# Patient Record
Sex: Male | Born: 1980
Health system: Southern US, Community
[De-identification: ages and names within clinical notes are randomized; demographics above are authoritative.]

## PROBLEM LIST (undated history)

## (undated) DIAGNOSIS — Z22322 Carrier or suspected carrier of Methicillin resistant Staphylococcus aureus: Secondary | ICD-10-CM

## (undated) DIAGNOSIS — K219 Gastro-esophageal reflux disease without esophagitis: Secondary | ICD-10-CM

## (undated) DIAGNOSIS — F419 Anxiety disorder, unspecified: Secondary | ICD-10-CM

## (undated) DIAGNOSIS — J45909 Unspecified asthma, uncomplicated: Secondary | ICD-10-CM

## (undated) HISTORY — PX: HERNIA REPAIR: SHX51

## (undated) HISTORY — PX: CHOLECYSTECTOMY: SHX55

---

## 2009-02-14 ENCOUNTER — Emergency Department (HOSPITAL_BASED_OUTPATIENT_CLINIC_OR_DEPARTMENT_OTHER): Admission: EM | Admit: 2009-02-14 | Discharge: 2009-02-14 | Payer: Self-pay | Admitting: Emergency Medicine

## 2009-02-14 ENCOUNTER — Ambulatory Visit: Payer: Self-pay | Admitting: Diagnostic Radiology

## 2009-05-18 ENCOUNTER — Emergency Department (HOSPITAL_BASED_OUTPATIENT_CLINIC_OR_DEPARTMENT_OTHER): Admission: EM | Admit: 2009-05-18 | Discharge: 2009-05-18 | Payer: Self-pay | Admitting: Emergency Medicine

## 2009-05-18 ENCOUNTER — Ambulatory Visit: Payer: Self-pay | Admitting: Radiology

## 2009-07-20 ENCOUNTER — Emergency Department (HOSPITAL_BASED_OUTPATIENT_CLINIC_OR_DEPARTMENT_OTHER): Admission: EM | Admit: 2009-07-20 | Discharge: 2009-07-20 | Payer: Self-pay | Admitting: Emergency Medicine

## 2009-07-20 ENCOUNTER — Ambulatory Visit: Payer: Self-pay | Admitting: Radiology

## 2009-08-29 ENCOUNTER — Encounter: Admission: RE | Admit: 2009-08-29 | Discharge: 2009-10-11 | Payer: Self-pay | Admitting: Orthopedic Surgery

## 2009-11-24 ENCOUNTER — Emergency Department (HOSPITAL_BASED_OUTPATIENT_CLINIC_OR_DEPARTMENT_OTHER): Admission: EM | Admit: 2009-11-24 | Discharge: 2009-11-24 | Payer: Self-pay | Admitting: Emergency Medicine

## 2010-02-02 ENCOUNTER — Emergency Department (HOSPITAL_BASED_OUTPATIENT_CLINIC_OR_DEPARTMENT_OTHER)
Admission: EM | Admit: 2010-02-02 | Discharge: 2010-02-02 | Payer: Self-pay | Source: Home / Self Care | Admitting: Emergency Medicine

## 2010-02-02 ENCOUNTER — Ambulatory Visit: Payer: Self-pay | Admitting: Diagnostic Radiology

## 2010-06-15 LAB — POCT CARDIAC MARKERS
CKMB, poc: <1 ng/mL — ABNORMAL LOW (ref 1.0–8.0)
Troponin i, poc: <0.05 ng/mL (ref 0.00–0.09)

## 2010-11-17 ENCOUNTER — Emergency Department (HOSPITAL_BASED_OUTPATIENT_CLINIC_OR_DEPARTMENT_OTHER)
Admission: EM | Admit: 2010-11-17 | Discharge: 2010-11-17 | Disposition: A | Discharge status: Home / Self Care | Payer: Self-pay | Attending: Emergency Medicine | Admitting: Emergency Medicine

## 2010-11-17 ENCOUNTER — Encounter: Payer: Self-pay | Admitting: *Deleted

## 2010-11-17 DIAGNOSIS — K219 Gastro-esophageal reflux disease without esophagitis: Secondary | ICD-10-CM | POA: Insufficient documentation

## 2010-11-17 DIAGNOSIS — L0390 Cellulitis, unspecified: Secondary | ICD-10-CM

## 2010-11-17 DIAGNOSIS — N498 Inflammatory disorders of other specified male genital organs: Secondary | ICD-10-CM | POA: Insufficient documentation

## 2010-11-17 DIAGNOSIS — Z8614 Personal history of Methicillin resistant Staphylococcus aureus infection: Secondary | ICD-10-CM | POA: Insufficient documentation

## 2010-11-17 HISTORY — DX: Carrier or suspected carrier of methicillin resistant Staphylococcus aureus: Z22.322

## 2010-11-17 HISTORY — DX: Gastro-esophageal reflux disease without esophagitis: K21.9

## 2010-11-17 MED ORDER — CLINDAMYCIN HCL 150 MG PO CAPS: 150.0000 mg | ORAL_CAPSULE | Freq: Four times a day (QID) | ORAL | Status: AC

## 2010-11-17 NOTE — ED Provider Notes (Signed)
History     CSN: [id number] Arrival date & time: 11/17/2010  9:44 PM  Chief Complaint  Patient presents with  . Abscess   Patient is a 30 y.o. male presenting with abscess. The history is provided by the patient.  Abscess  This is a recurrent problem. The current episode started less than one week ago. The problem occurs frequently. The problem has been unchanged. The abscess is present on the genitalia. The problem is moderate. The abscess is characterized by painfulness. Associated with: none. The abscess first occurred at home. Pertinent negatives include no fever, no diarrhea and no vomiting. Fever duration: subj fever this afternoon at home took some tylenol. He has been experiencing a mild sore throat. Neither side is more painful than the other. The sore throat is characterized by pain only. There were no sick contacts. Recent Medical Care: was recently on clindamycin for MRSA and about a week after being off of that medication developed new abscess.    Now has 3 small areas on his scrotum that are developing, one draining, no redness, they feel the same as previous abscesses.  PT feels lightheaded today, no HAs, no unilateral weakness, no trouble urinating.  Past Medical History  Diagnosis Date  . MRSA (methicillin resistant staph aureus) culture positive   . GERD (gastroesophageal reflux disease)     History reviewed. No pertinent past surgical history.  History reviewed. No pertinent family history.  History  Substance Use Topics  . Smoking status: Never Smoker   . Smokeless tobacco: Not on file  . Alcohol Use: Yes      Review of Systems  Constitutional: Negative for fever and chills.  HENT: Negative for neck pain and neck stiffness.   Eyes: Negative for pain.  Respiratory: Negative for shortness of breath.   Cardiovascular: Negative for chest pain.  Gastrointestinal: Negative for vomiting, abdominal pain and diarrhea.  Genitourinary: Negative for dysuria, discharge  and penile pain.  Musculoskeletal: Negative for myalgias, joint swelling and arthralgias.  Skin: Negative for rash.  Neurological: Negative for headaches.  All other systems reviewed and are negative.    Physical Exam  BP 121/69  Pulse 85  Temp(Src) 98.2 F (36.8 C) (Oral)  Resp 18  Ht 6\' 3"  (1.905 m)  Wt 245 lb (111.131 kg)  BMI 30.62 kg/m2  SpO2 99%  Physical Exam  Constitutional: He is oriented to person, place, and time. He appears well-developed and well-nourished.  HENT:  Head: Normocephalic and atraumatic.  Eyes: Conjunctivae and EOM are normal. Pupils are equal, round, and reactive to light.  Neck: Trachea normal. Neck supple. No thyromegaly present.  Cardiovascular: Normal rate, regular rhythm, S1 normal, S2 normal and normal pulses.     No systolic murmur is present   No diastolic murmur is present  Pulses:      Radial pulses are 2+ on the right side, and 2+ on the left side.  Pulmonary/Chest: Effort normal and breath sounds normal. He has no wheezes. He has no rhonchi. He has no rales. He exhibits no tenderness.  Abdominal: Soft. Normal appearance and bowel sounds are normal. There is no tenderness. There is no CVA tenderness and negative Murphy's sign.  Genitourinary:       3 small areas of mild TTP over scrotum, no active draining, no erythema or inc warmth to touch, no penile or testicular abscess. All areas are less than 1cm, no enalrged abscess and no cellulitis.   Musculoskeletal:       BLE:s  Calves nontender, no cords or erythema, negative Homans sign  Neurological: He is alert and oriented to person, place, and time. He has normal strength. No cranial nerve deficit or sensory deficit. GCS eye subscore is 4. GCS verbal subscore is 5. GCS motor subscore is 6.  Skin: Skin is warm and dry. No rash noted. He is not diaphoretic.  Psychiatric: His speech is normal.       Cooperative and appropriate    ED Course  Procedures  MDM 3 small areas of developing  abscess on the scrotum, has h/o multiple abscesses in the recent past improved with clinda. Plan RX now, no indication for IV ABx and no areas that are large enough to drain. Urology referral provided with strict return precautions for any worsening condition.       Sunnie Nielsen, MD 11/17/10 2308

## 2010-11-17 NOTE — ED Notes (Signed)
Pt has MRSA and developed abscesses on his scrotum x1 week. Pt sts he now has a fever and sore throat.

## 2011-01-05 ENCOUNTER — Emergency Department (HOSPITAL_BASED_OUTPATIENT_CLINIC_OR_DEPARTMENT_OTHER): Admission: EM | Admit: 2011-01-05 | Discharge: 2011-01-05 | Discharge status: Against Medical Advice | Payer: Self-pay

## 2011-01-05 NOTE — ED Notes (Signed)
Unable to find pt when called in w.a. He told registration he had to leave and pick up his child from school.

## 2011-11-11 IMAGING — CR DG FOOT COMPLETE 3+V*R*
3 series · 3 of 3 positions shown · non-contrast
Comparison: The

CLINICAL DATA: Medial and lateral foot pain following blunt trauma

RIGHT FOOT COMPLETE - 3+ VIEW

[t foot ap right]
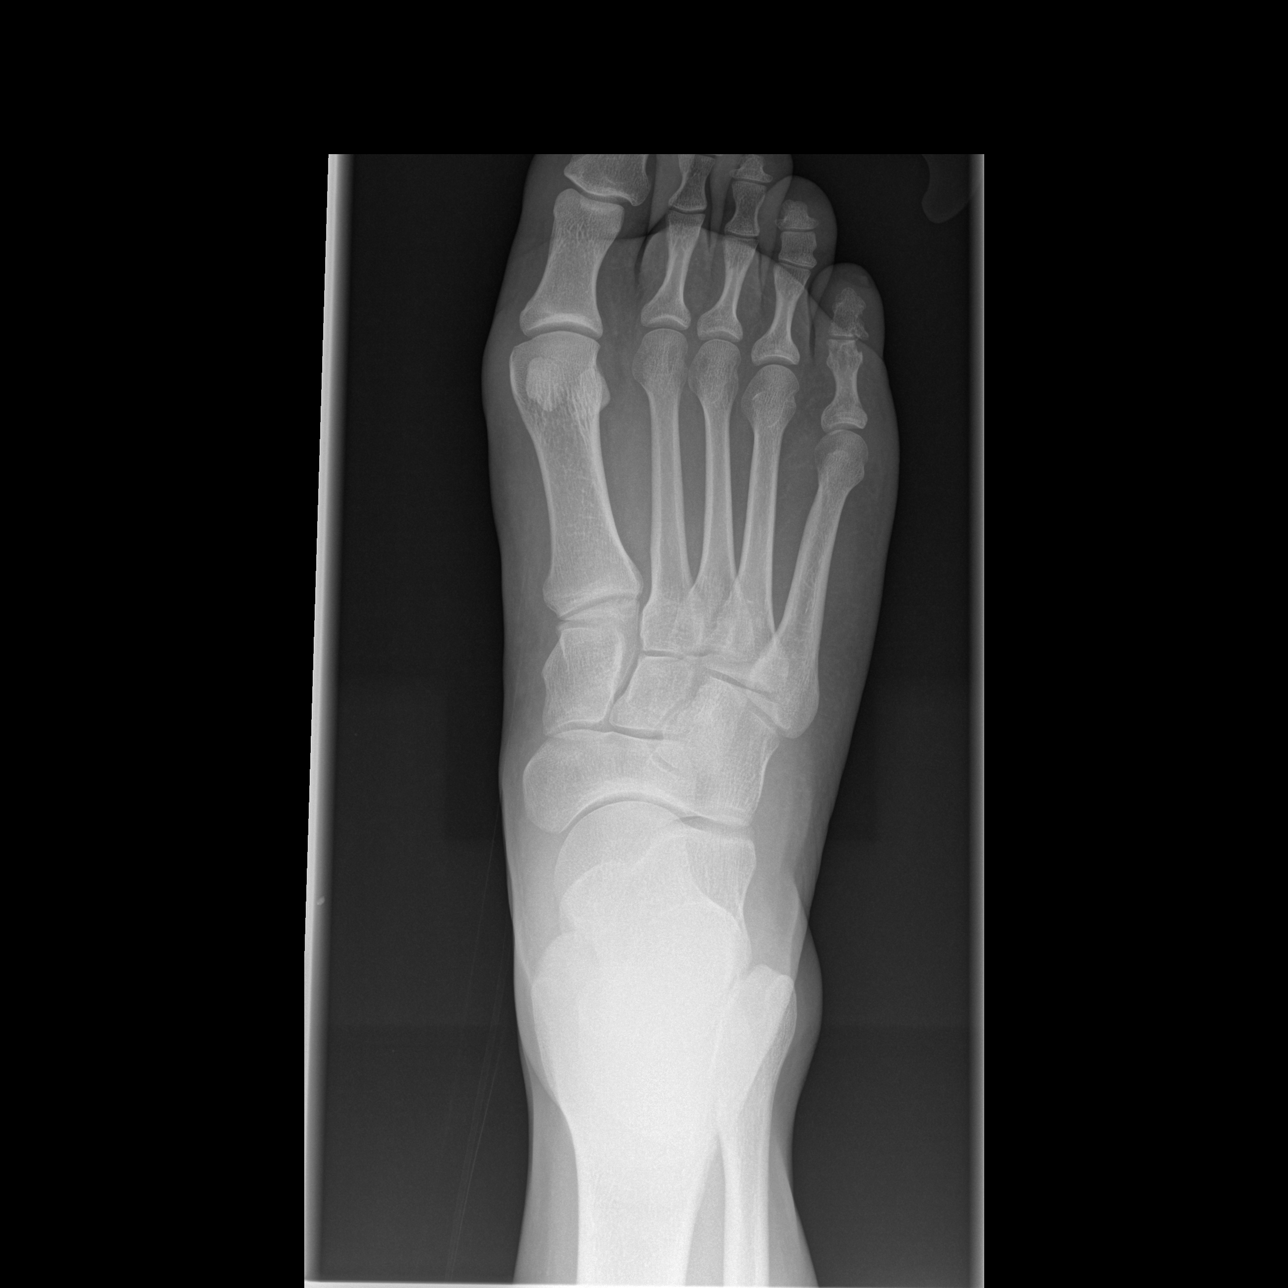

[t foot oblique right]
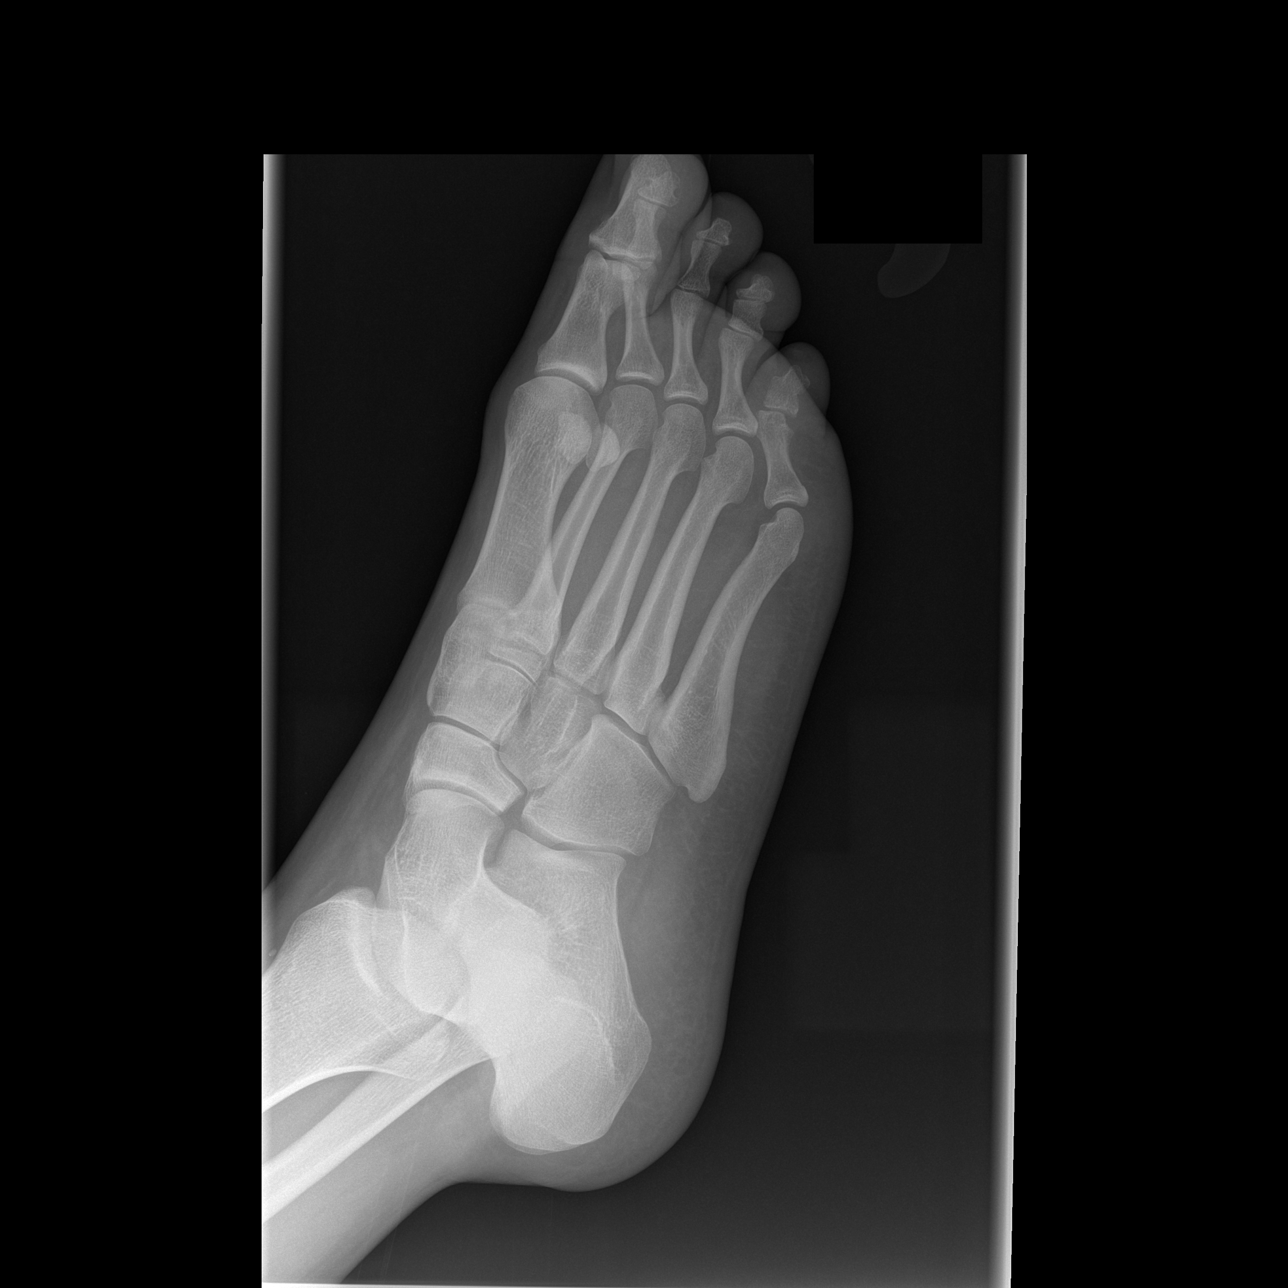

[t foot lat right]
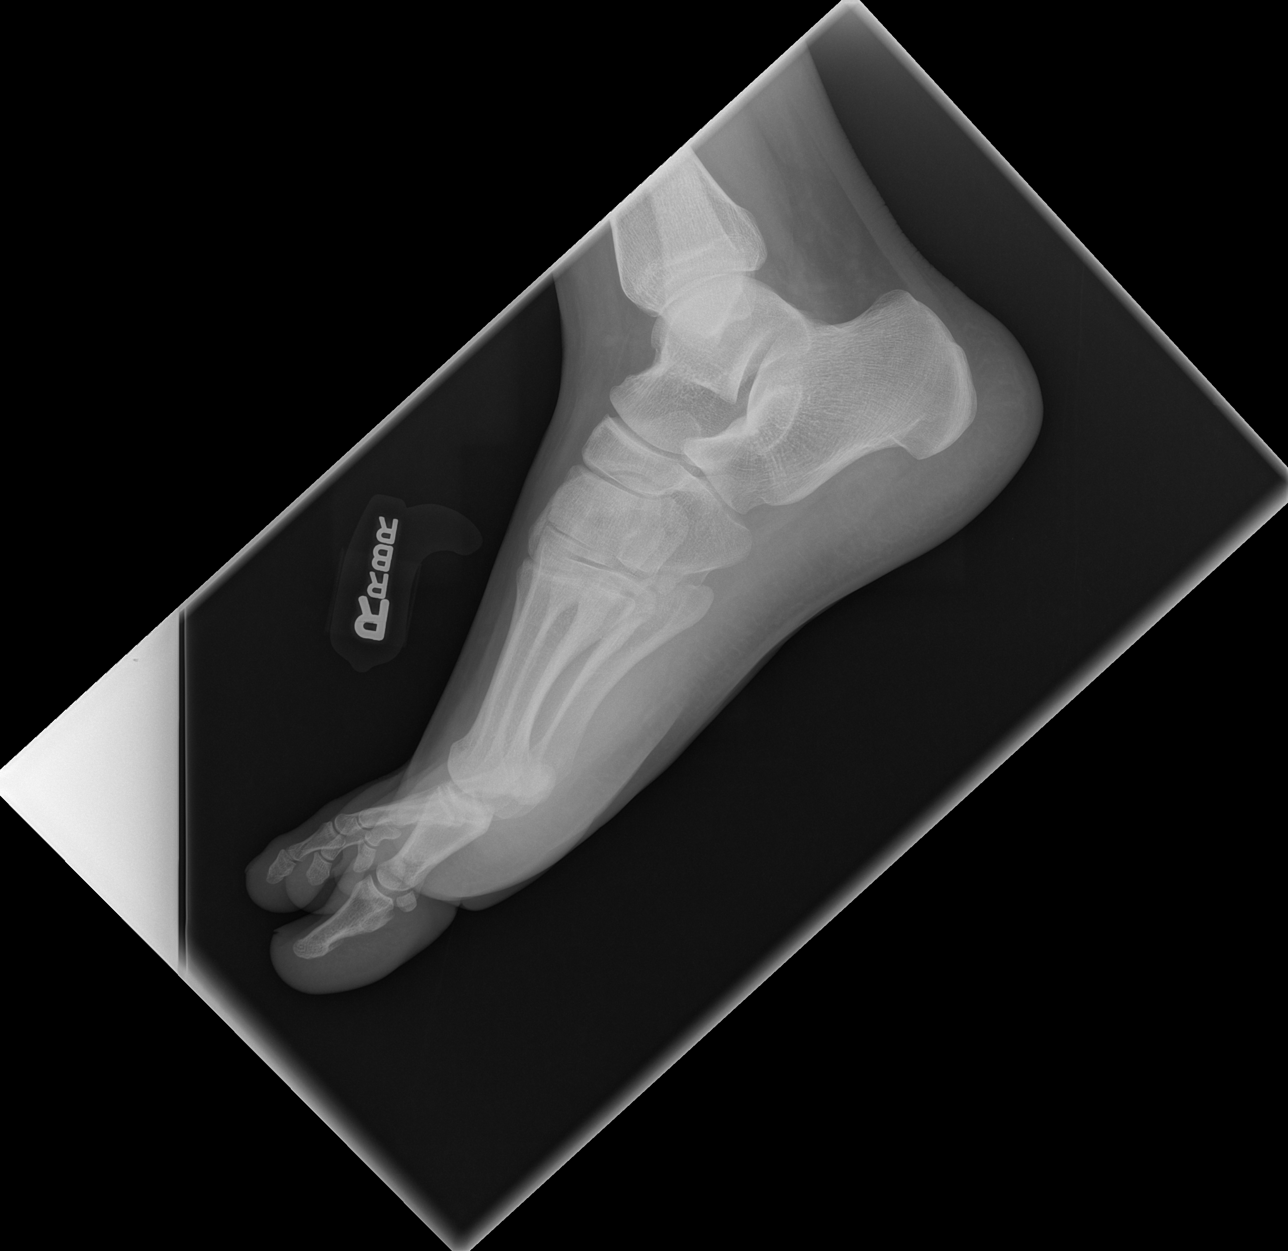

[3 of 3 positions shown; findings below may reference images not displayed]

FINDINGS: No fracture or dislocation.  No foreign body or other
abnormality of the soft tissues.
IMPRESSION: No acute or significant findings.

## 2011-11-13 ENCOUNTER — Encounter (HOSPITAL_BASED_OUTPATIENT_CLINIC_OR_DEPARTMENT_OTHER): Payer: Self-pay | Admitting: *Deleted

## 2011-11-13 ENCOUNTER — Emergency Department (HOSPITAL_BASED_OUTPATIENT_CLINIC_OR_DEPARTMENT_OTHER)
Admission: EM | Admit: 2011-11-13 | Discharge: 2011-11-14 | Disposition: A | Discharge status: Home / Self Care | Payer: Self-pay | Attending: Emergency Medicine | Admitting: Emergency Medicine

## 2011-11-13 DIAGNOSIS — K219 Gastro-esophageal reflux disease without esophagitis: Secondary | ICD-10-CM | POA: Insufficient documentation

## 2011-11-13 DIAGNOSIS — Z8614 Personal history of Methicillin resistant Staphylococcus aureus infection: Secondary | ICD-10-CM | POA: Insufficient documentation

## 2011-11-13 DIAGNOSIS — W268XXA Contact with other sharp object(s), not elsewhere classified, initial encounter: Secondary | ICD-10-CM | POA: Insufficient documentation

## 2011-11-13 DIAGNOSIS — S61209A Unspecified open wound of unspecified finger without damage to nail, initial encounter: Secondary | ICD-10-CM | POA: Insufficient documentation

## 2011-11-13 DIAGNOSIS — Y998 Other external cause status: Secondary | ICD-10-CM | POA: Insufficient documentation

## 2011-11-13 DIAGNOSIS — Y9389 Activity, other specified: Secondary | ICD-10-CM | POA: Insufficient documentation

## 2011-11-13 DIAGNOSIS — S61219A Laceration without foreign body of unspecified finger without damage to nail, initial encounter: Secondary | ICD-10-CM

## 2011-11-13 MED ORDER — LIDOCAINE HCL 2 % IJ SOLN: 20.0000 mL | Freq: Once | INTRAMUSCULAR | Status: DC

## 2011-11-13 MED ORDER — LIDOCAINE HCL 2 % IJ SOLN
INTRAMUSCULAR | Status: AC
  Filled 2011-11-13: qty 1

## 2011-11-13 NOTE — ED Provider Notes (Signed)
History     CSN: [id number]  Arrival date & time 11/13/11  2211   First MD Initiated Contact with Patient 11/13/11 2248      Chief Complaint  Patient presents with  . Laceration    (Consider location/radiation/quality/duration/timing/severity/associated sxs/prior treatment) Patient is a 31 y.o. male presenting with skin laceration. The history is provided by the patient.  Laceration  The incident occurred less than 1 hour ago (Patient was breaking a metal panhandling half to put in the trash and it lacerated his finger). The laceration is located on the left hand. Size: Laceration to the right  fourth and  right fifth fingers. The laceration mechanism was a a metal edge. The pain is at a severity of 4/10. The pain is moderate. The pain has been constant since onset. He reports no foreign bodies present. His tetanus status is UTD.    Past Medical History  Diagnosis Date  . MRSA (methicillin resistant staph aureus) culture positive   . GERD (gastroesophageal reflux disease)     History reviewed. No pertinent past surgical history.  History reviewed. No pertinent family history.  History  Substance Use Topics  . Smoking status: Never Smoker   . Smokeless tobacco: Not on file  . Alcohol Use: Yes      Review of Systems  All other systems reviewed and are negative.    Allergies  Bactrim and Cephalexin  Home Medications   Current Outpatient Rx  Name Route Sig Dispense Refill  . ACETAMINOPHEN 500 MG PO TABS Oral Take by mouth every 6 (six) hours as needed. pain    . DIPHENHYDRAMINE HCL 25 MG PO TABS Oral Take 25 mg by mouth every 6 (six) hours as needed.    Marland Kitchen FLAXSEED OIL PO Oral Take 1 capsule by mouth daily.    Marland Kitchen PERCOCET PO Oral Take 1 tablet by mouth daily as needed. For pain.    Marland Kitchen RANITIDINE HCL 150 MG PO TABS Oral Take 150 mg by mouth 2 (two) times daily.        BP 134/78  Pulse 86  Temp 98.9 F (37.2 C) (Oral)  Resp 16  Ht 6\' 3"  (1.905 m)  Wt 240 lb  (108.863 kg)  BMI 30.00 kg/m2  SpO2 100%  Physical Exam  Nursing note and vitals reviewed. Constitutional: He is oriented to person, place, and time. He appears well-developed and well-nourished. No distress.  HENT:  Head: Normocephalic and atraumatic.  Mouth/Throat: Oropharynx is clear and moist.  Eyes: EOM are normal. Pupils are equal, round, and reactive to light.  Musculoskeletal:       Left hand: He exhibits laceration. He exhibits normal two-point discrimination. normal sensation noted. Normal strength noted.       Hands:      Tendon function of the fourth and fifth digits on the right hand. Extension at the PIP and DIP joints consistent with intact digitorum superficialis and profundus  Neurological: He is alert and oriented to person, place, and time. He has normal strength. No sensory deficit.  Skin: Skin is warm and dry. No rash noted. No erythema.    ED Course  Procedures (including critical care time)  Labs Reviewed - No data to display No results found.  LACERATION REPAIR Performed by: Gwyneth Sprout Authorized byGwyneth Sprout Consent: Verbal consent obtained. Risks and benefits: risks, benefits and alternatives were discussed Consent given by: patient Patient identity confirmed: provided demographic data Prepped and Draped in normal sterile fashion Wound explored  Laceration Location: Right  fifth finger proximal on the dorsal surface  Laceration Length: 4 cm  No Foreign Bodies seen or palpated  Anesthesia: local infiltration  Local anesthetic: lidocaine 1 % without epinephrine  Anesthetic total: 2 ml  Irrigation method: syringe Amount of cleaning: standard  Skin closure: 4.0 Prolene   Number of sutures: 7   Technique: Simple and are   Patient tolerance: Patient tolerated the procedure well with no immediate complications. LACERATION REPAIR Performed by: Gwyneth Sprout Authorized byGwyneth Sprout Consent: Verbal consent  obtained. Risks and benefits: risks, benefits and alternatives were discussed Consent given by: patient Patient identity confirmed: provided demographic data Prepped and Draped in normal sterile fashion Wound explored  Laceration Location: Right fourth finger proximal  Laceration Length: 2 cm  No Foreign Bodies seen or palpated  Anesthesia: local infiltration  Local anesthetic: lidocaine 1 % without epinephrine  Anesthetic total: 2 ml  Irrigation method: syringe Amount of cleaning: standard  Skin closure: 4.0 Prolene   Number of sutures: 4   Technique: Simple interrupted   Patient tolerance: Patient tolerated the procedure well with no immediate complications.   1. Finger laceration       MDM   Patient with laceration to the hand from metal object. No concern for foreign bodies. Wounds were explored and there was no sign of foreign body. They were deep lacerations to the the fourth of the fingers however patient has full range of motion without any signs of tendon involvement. He had normal sensation, capillary refill and movement. Wounds were repaired. Tetanus shot is up-to-date.        Gwyneth Sprout, MD 11/14/11 0009

## 2011-11-13 NOTE — ED Notes (Signed)
Pt c/o laceration to right 3rf and 4th finger by metal x 1 hr ago

## 2011-11-13 NOTE — ED Notes (Addendum)
MD at bedside. 

## 2011-11-22 ENCOUNTER — Emergency Department (HOSPITAL_BASED_OUTPATIENT_CLINIC_OR_DEPARTMENT_OTHER)
Admission: EM | Admit: 2011-11-22 | Discharge: 2011-11-23 | Disposition: A | Discharge status: Home / Self Care | Payer: Self-pay | Attending: Emergency Medicine | Admitting: Emergency Medicine

## 2011-11-22 ENCOUNTER — Encounter (HOSPITAL_BASED_OUTPATIENT_CLINIC_OR_DEPARTMENT_OTHER): Payer: Self-pay | Admitting: *Deleted

## 2011-11-22 DIAGNOSIS — Z4802 Encounter for removal of sutures: Secondary | ICD-10-CM | POA: Insufficient documentation

## 2011-11-22 DIAGNOSIS — M79641 Pain in right hand: Secondary | ICD-10-CM

## 2011-11-22 DIAGNOSIS — M79609 Pain in unspecified limb: Secondary | ICD-10-CM | POA: Insufficient documentation

## 2011-11-22 DIAGNOSIS — K219 Gastro-esophageal reflux disease without esophagitis: Secondary | ICD-10-CM | POA: Insufficient documentation

## 2011-11-22 DIAGNOSIS — Z8614 Personal history of Methicillin resistant Staphylococcus aureus infection: Secondary | ICD-10-CM | POA: Insufficient documentation

## 2011-11-22 NOTE — ED Notes (Signed)
Reports a rubber band type sensation that snaps when he moves both fingers

## 2011-11-22 NOTE — ED Notes (Signed)
Pt had lacerations to the 4th/5th digits of his right hand. Was seen here and had sutures and splint placed. Pt states the site was sore, but over the past two days the pain has worsened considerably. Denies fevers or worsening of wound appearance. Pt was scheduled to return tomorrow for suture removal.

## 2011-11-22 NOTE — ED Provider Notes (Addendum)
History     CSN: [id number]  Arrival date & time 11/22/11  2252   First MD Initiated Contact with Patient 11/22/11 2354      Chief Complaint  Patient presents with  . Hand Pain    (Consider location/radiation/quality/duration/timing/severity/associated sxs/prior treatment) HPI This is a 31 year old black male who was seen here on the 20th of this month for lacerations of the right hand. Specifically he had lacerations over the palmar aspect of the right fourth and fifth metacarpophalangeal joints. These were sutured and he was placed in a splint. His fingers were noted to be neurovascularly intact at the time with intact tendon function. He returns stating he has had increased pain in his right third fourth and fifth fingers. The pain is an achy pain, moderate in severity, worse with movement. He states he has not changed the amount of use of those fingers recently. He has been wearing a splint and taking it off for passive range of motion exercises. There is no erythema, swelling or drainage. He states his right fifth finger is somewhat harder to move them his other fingers; he describes it as feeling like there is a rubber band inside of it.  Past Medical History  Diagnosis Date  . MRSA (methicillin resistant staph aureus) culture positive   . GERD (gastroesophageal reflux disease)     History reviewed. No pertinent past surgical history.  No family history on file.  History  Substance Use Topics  . Smoking status: Never Smoker   . Smokeless tobacco: Not on file  . Alcohol Use: Yes      Review of Systems  All other systems reviewed and are negative.    Allergies  Bactrim and Cephalexin  Home Medications   Current Outpatient Rx  Name Route Sig Dispense Refill  . ACETAMINOPHEN 500 MG PO TABS Oral Take by mouth every 6 (six) hours as needed. pain    . DIPHENHYDRAMINE HCL 25 MG PO TABS Oral Take 25 mg by mouth every 6 (six) hours as needed. allergy    . FLAXSEED OIL  PO Oral Take 1 capsule by mouth 3 (three) times daily.     Marland Kitchen PERCOCET PO Oral Take 1 tablet by mouth daily as needed. For pain.    Marland Kitchen RANITIDINE HCL 150 MG PO TABS Oral Take 150 mg by mouth 2 (two) times daily.        BP 136/79  Pulse 90  Temp 98.4 F (36.9 C) (Oral)  Resp 18  Ht 6\' 3"  (1.905 m)  Wt 240 lb (108.863 kg)  BMI 30.00 kg/m2  SpO2 99%  Physical Exam General: Well-developed, well-nourished male in no acute distress; appearance consistent with age of record HENT: normocephalic, atraumatic Eyes: Normal appearance Neck: supple Heart: regular rate and rhythm Lungs: Normal respiratory effort and excursion Abdomen: soft; nondistended Extremities: No deformity; full range of motion, mild limitation of strength in the right fifth finger; sutured wounds healing well without erythema, warmth, swelling, lymphangitis, localized tenderness or drainage; right fourth and fifth fingers neurovascularly intact Neurologic: Awake, alert and oriented; motor function intact in all extremities and symmetric; no facial droop Skin: Warm and dry Psychiatric: Normal mood and affect    ED Course  Procedures (including critical care time)  SUTURE REMOVAL Performed by: Paula Libra L  Consent: Verbal consent obtained. Consent given by: patient Required items: required blood products, implants, devices, and special equipment available Time out: Immediately prior to procedure a "time out" was called to verify the correct patient,  procedure, equipment, support staff and site/side marked as required.  Location: Right fourth and fifth fingers at the palmar metacarpophalangeal joint  Wound Appearance: clean  Sutures/Staples Removed: 7 (5th finger) + 4 (4th finger)  Patient tolerance: Patient tolerated the procedure well with no immediate complications.     MDM  Patient was advised to continue range of motion exercises and to gradually increase use of his right hand. He was advised to use an  NSAID such as Aleve for pain. He states he has had a history of narcotic abuse and wishes to avoid narcotic pain medication.        Hanley Seamen, MD 11/23/11 0008  Hanley Seamen, MD 11/23/11 1610

## 2011-11-23 MED ORDER — NAPROXEN 250 MG PO TABS: 500.0000 mg | ORAL_TABLET | Freq: Once | ORAL | Status: DC

## 2012-01-21 ENCOUNTER — Emergency Department (HOSPITAL_BASED_OUTPATIENT_CLINIC_OR_DEPARTMENT_OTHER): Payer: Self-pay

## 2012-01-21 ENCOUNTER — Encounter (HOSPITAL_BASED_OUTPATIENT_CLINIC_OR_DEPARTMENT_OTHER): Payer: Self-pay | Admitting: *Deleted

## 2012-01-21 ENCOUNTER — Emergency Department (HOSPITAL_BASED_OUTPATIENT_CLINIC_OR_DEPARTMENT_OTHER)
Admission: EM | Admit: 2012-01-21 | Discharge: 2012-01-22 | Disposition: A | Discharge status: Home / Self Care | Payer: Self-pay | Attending: Emergency Medicine | Admitting: Emergency Medicine

## 2012-01-21 DIAGNOSIS — IMO0002 Reserved for concepts with insufficient information to code with codable children: Secondary | ICD-10-CM | POA: Insufficient documentation

## 2012-01-21 DIAGNOSIS — Y9289 Other specified places as the place of occurrence of the external cause: Secondary | ICD-10-CM | POA: Insufficient documentation

## 2012-01-21 DIAGNOSIS — Y9389 Activity, other specified: Secondary | ICD-10-CM | POA: Insufficient documentation

## 2012-01-21 DIAGNOSIS — Z79899 Other long term (current) drug therapy: Secondary | ICD-10-CM | POA: Insufficient documentation

## 2012-01-21 DIAGNOSIS — S93609A Unspecified sprain of unspecified foot, initial encounter: Secondary | ICD-10-CM | POA: Insufficient documentation

## 2012-01-21 DIAGNOSIS — Z8614 Personal history of Methicillin resistant Staphylococcus aureus infection: Secondary | ICD-10-CM | POA: Insufficient documentation

## 2012-01-21 DIAGNOSIS — S93602A Unspecified sprain of left foot, initial encounter: Secondary | ICD-10-CM

## 2012-01-21 DIAGNOSIS — K219 Gastro-esophageal reflux disease without esophagitis: Secondary | ICD-10-CM | POA: Insufficient documentation

## 2012-01-21 NOTE — ED Notes (Signed)
Left foot pain. Swelling. States he may have hurt it at a party this weekend but cant be for sure. Has been using ice and elevation for the swelling.

## 2012-01-21 NOTE — ED Provider Notes (Signed)
History   This chart was scribed for Hanley Seamen, MD by Albertha Ghee Rifaie. This patient was seen in room MH12/[facility]12 and the patient's care was started at 11:55 PM.    CSN: [id number]  Arrival date & time 01/21/12  2203   None     Chief Complaint  Patient presents with  . Foot Pain     HPI  Bryan Gill is a 31 y.o. male who presents to the Emergency Department complaining of couple of days of gradually worsening, gradual onset, moderate left foot pain associated with swelling. Pain is aggravated with movement and applying pressure on it. He states he has been in a party 3 days ago and he might hit his foot with something. Pt has been appling ice-pack for pain and it seemed to alleviate the pain. He also had been taking some OTC for pain and they have been helping reduce his pain. He denies fever, chills, emesis, nausea. Pt denies smoking and alcohol use.   Past Medical History  Diagnosis Date  . MRSA (methicillin resistant staph aureus) culture positive   . GERD (gastroesophageal reflux disease)     History reviewed. No pertinent past surgical history.  No family history on file.  History  Substance Use Topics  . Smoking status: Never Smoker   . Smokeless tobacco: Not on file  . Alcohol Use: Yes      Review of Systems  All other systems reviewed and are negative.    Allergies  Bactrim and Cephalexin  Home Medications   Current Outpatient Rx  Name Route Sig Dispense Refill  . ACETAMINOPHEN 500 MG PO TABS Oral Take by mouth every 6 (six) hours as needed. pain    . DIPHENHYDRAMINE HCL 25 MG PO TABS Oral Take 25 mg by mouth every 6 (six) hours as needed. allergy    . FLAXSEED OIL PO Oral Take 1 capsule by mouth 3 (three) times daily.     Marland Kitchen PERCOCET PO Oral Take 1 tablet by mouth daily as needed. For pain.    Marland Kitchen RANITIDINE HCL 150 MG PO TABS Oral Take 150 mg by mouth 2 (two) times daily.        BP 140/75  Pulse 101  Temp 98.7 F (37.1 C) (Oral)  Resp 20   SpO2 99%  Physical Exam  Constitutional: He is oriented to person, place, and time. He appears well-developed and well-nourished. No distress.  HENT:  Head: Normocephalic and atraumatic.  Eyes: EOM are normal. Pupils are equal, round, and reactive to light.  Neck: Normal range of motion. Neck supple.  Cardiovascular: Normal rate, regular rhythm and normal heart sounds.   Pulmonary/Chest: Effort normal and breath sounds normal. No respiratory distress.  Abdominal: Soft. Bowel sounds are normal. There is no tenderness.  Musculoskeletal:       Left foot dorsal swollen w/o erythema or warmth  Left drsal tenderness  Dorsal capillary refill is normal  Hurt with applied pressure on left foot.   Neurological: He is alert and oriented to person, place, and time.  Skin: Skin is warm. No rash noted. No erythema.    ED Course  Procedures (including critical care time)   DIAGNOSTIC STUDIES: Oxygen Saturation is 99% on room air, normal by my interpretation.    COORDINATION OF CARE: 11:51 PM Discussed treatment plan with pt at bedside and pt agreed to plan.   MDM   Nursing notes and vitals signs, including pulse oximetry, reviewed.  Summary of this visit's results,  reviewed by myself:  Imaging Studies: Dg Foot Complete Left  01/21/2012  *RADIOLOGY REPORT*  Clinical Data: Left foot pain.  LEFT FOOT - COMPLETE 3+ VIEW  Comparison: None.  Findings: Soft tissue swelling is present over the dorsum of the foot.  There is no underlying fracture.  No radiopaque foreign body is evident.  IMPRESSION:  1.  Moderate soft tissue swelling of the dorsum of the foot.  This raises concern for cellulitis. 2.  No acute osseous abnormality or radiopaque foreign body.   Original Report Authenticated By: Jamesetta Orleans. MATTERN, M.D.    Patient does not wish any additional analgesia. He states he was here primarily to see if he had a fracture. We'll provide him a post-op shoe and advise use of crutches to allow  foot the heel.       I personally performed the services described in this documentation, which was scribed in my presence.  The recorded information has been reviewed and considered.     Hanley Seamen, MD 01/22/12 [phone]

## 2012-04-05 ENCOUNTER — Encounter (HOSPITAL_BASED_OUTPATIENT_CLINIC_OR_DEPARTMENT_OTHER): Payer: Self-pay | Admitting: Emergency Medicine

## 2012-04-05 ENCOUNTER — Emergency Department (HOSPITAL_BASED_OUTPATIENT_CLINIC_OR_DEPARTMENT_OTHER)
Admission: EM | Admit: 2012-04-05 | Discharge: 2012-04-05 | Disposition: A | Discharge status: Home / Self Care | Payer: Self-pay | Attending: Emergency Medicine | Admitting: Emergency Medicine

## 2012-04-05 DIAGNOSIS — Z79899 Other long term (current) drug therapy: Secondary | ICD-10-CM | POA: Insufficient documentation

## 2012-04-05 DIAGNOSIS — Z8719 Personal history of other diseases of the digestive system: Secondary | ICD-10-CM | POA: Insufficient documentation

## 2012-04-05 DIAGNOSIS — L600 Ingrowing nail: Secondary | ICD-10-CM | POA: Insufficient documentation

## 2012-04-05 DIAGNOSIS — Z8614 Personal history of Methicillin resistant Staphylococcus aureus infection: Secondary | ICD-10-CM | POA: Insufficient documentation

## 2012-04-05 MED ORDER — DOXYCYCLINE HYCLATE 100 MG PO TABS
100.0000 mg | ORAL_TABLET | Freq: Once | ORAL | Status: AC
  Administered 2012-04-05: 100 mg via ORAL

## 2012-04-05 MED ORDER — DOXYCYCLINE HYCLATE 100 MG PO CAPS: 100.0000 mg | ORAL_CAPSULE | Freq: Two times a day (BID) | ORAL | Status: DC

## 2012-04-05 MED ORDER — HYDROCODONE-ACETAMINOPHEN 5-325 MG PO TABS: 1.0000 | ORAL_TABLET | Freq: Four times a day (QID) | ORAL | Status: DC | PRN

## 2012-04-05 MED ORDER — DOXYCYCLINE HYCLATE 100 MG PO TABS
ORAL_TABLET | ORAL | Status: AC
  Filled 2012-04-05: qty 1

## 2012-04-05 MED ORDER — CEPHALEXIN 250 MG PO CAPS: 500.0000 mg | ORAL_CAPSULE | Freq: Once | ORAL | Status: DC

## 2012-04-05 MED ORDER — OXYCODONE-ACETAMINOPHEN 5-325 MG PO TABS: 1.0000 | ORAL_TABLET | Freq: Four times a day (QID) | ORAL | Status: DC | PRN

## 2012-04-05 MED ORDER — CEPHALEXIN 500 MG PO CAPS: 500.0000 mg | ORAL_CAPSULE | Freq: Four times a day (QID) | ORAL | Status: DC

## 2012-04-05 MED ORDER — BUPIVACAINE HCL 0.5 % IJ SOLN
50.0000 mL | Freq: Once | INTRAMUSCULAR | Status: AC
  Administered 2012-04-05: 50 mL
  Filled 2012-04-05: qty 1

## 2012-04-05 NOTE — ED Provider Notes (Addendum)
History     CSN: [id number]  Arrival date & time 04/05/12  0011   First MD Initiated Contact with Patient 04/05/12 0130      Chief Complaint  Patient presents with  . Ingrown Toenail        (Consider location/radiation/quality/duration/timing/severity/associated sxs/prior treatment) HPI This is a 32 year old male with a history of left great toe ingrown toenail. He has had the nail removed in the past by a podiatrist. He is here with pain, swelling and purulent drainage from the medial aspect of the left great toenail consistent with previous ingrown toenails. There is moderate pain associated with it. Symptoms are worse with ambulation or palpation.  Past Medical History  Diagnosis Date  . MRSA (methicillin resistant staph aureus) culture positive   . GERD (gastroesophageal reflux disease)     History reviewed. No pertinent past surgical history.  No family history on file.  History  Substance Use Topics  . Smoking status: Never Smoker   . Smokeless tobacco: Not on file  . Alcohol Use: Yes      Review of Systems  All other systems reviewed and are negative.    Allergies  Bactrim and Cephalexin  Home Medications   Current Outpatient Rx  Name  Route  Sig  Dispense  Refill  . AMOXICILLIN PO   Oral   Take by mouth 3 (three) times daily.         Marland Kitchen HYDROXYZINE PAMOATE 50 MG PO CAPS   Oral   Take 50 mg by mouth as needed.         . ACETAMINOPHEN 500 MG PO TABS   Oral   Take by mouth every 6 (six) hours as needed. pain         . DIPHENHYDRAMINE HCL 25 MG PO TABS   Oral   Take 25 mg by mouth every 6 (six) hours as needed. allergy         . FLAXSEED OIL PO   Oral   Take 1 capsule by mouth 3 (three) times daily.          Marland Kitchen PERCOCET PO   Oral   Take 1 tablet by mouth daily as needed. For pain.         Marland Kitchen RANITIDINE HCL 150 MG PO TABS   Oral   Take 150 mg by mouth 2 (two) times daily.             BP 130/81  Pulse 85  Temp 98.2 F  (36.8 C) (Oral)  Resp 16  Ht 6\' 3"  (1.905 m)  Wt 260 lb (117.935 kg)  BMI 32.50 kg/m2  SpO2 98%  Physical Exam General: Well-developed, well-nourished male in no acute distress; appearance consistent with age of record HENT: normocephalic, atraumatic Eyes: Normal appearance Neck: supple Heart: regular rate and rhythm Lungs: Normal respiratory effort and excursion Abdomen: soft; nondistended Extremities: No deformity; full range of motion; tenderness, inflammation and slight drainage from medial aspect of left great toenail Neurologic: Awake, alert and oriented; motor function intact in all extremities and symmetric; no facial droop Skin: Warm and dry Psychiatric: Normal mood and affect    ED Course  Procedures (including critical care time)  WEDGE EXCISION OF NAIL The patient's left great toe was anesthetized using a digital block. Approximately 6 mL of 0.5% bupivacaine without epinephrine were used. After adequate anesthesia was obtained a wedge of the patient's left great medial toe nail was excised. Blunt dissection was required to remove the wedge  from the nail bed, to which it was significantly adherent. The patient tolerated this well and there were no complications immediately.    MDM          Hanley Seamen, MD 04/05/12 0236  Hanley Seamen, MD 04/05/12 4098

## 2012-04-05 NOTE — ED Notes (Signed)
Pt c/o "infected ingrown toenail x several days." Pt reports pus, bleeding, pain to left big toe.

## 2012-05-07 ENCOUNTER — Encounter (HOSPITAL_BASED_OUTPATIENT_CLINIC_OR_DEPARTMENT_OTHER): Payer: Self-pay | Admitting: Emergency Medicine

## 2012-05-07 ENCOUNTER — Emergency Department (HOSPITAL_BASED_OUTPATIENT_CLINIC_OR_DEPARTMENT_OTHER)
Admission: EM | Admit: 2012-05-07 | Discharge: 2012-05-07 | Disposition: A | Discharge status: Home / Self Care | Payer: Self-pay | Attending: Emergency Medicine | Admitting: Emergency Medicine

## 2012-05-07 DIAGNOSIS — Z79899 Other long term (current) drug therapy: Secondary | ICD-10-CM | POA: Insufficient documentation

## 2012-05-07 DIAGNOSIS — J3489 Other specified disorders of nose and nasal sinuses: Secondary | ICD-10-CM | POA: Insufficient documentation

## 2012-05-07 DIAGNOSIS — IMO0001 Reserved for inherently not codable concepts without codable children: Secondary | ICD-10-CM | POA: Insufficient documentation

## 2012-05-07 DIAGNOSIS — J069 Acute upper respiratory infection, unspecified: Secondary | ICD-10-CM | POA: Insufficient documentation

## 2012-05-07 DIAGNOSIS — R112 Nausea with vomiting, unspecified: Secondary | ICD-10-CM | POA: Insufficient documentation

## 2012-05-07 DIAGNOSIS — Z8614 Personal history of Methicillin resistant Staphylococcus aureus infection: Secondary | ICD-10-CM | POA: Insufficient documentation

## 2012-05-07 DIAGNOSIS — K219 Gastro-esophageal reflux disease without esophagitis: Secondary | ICD-10-CM | POA: Insufficient documentation

## 2012-05-07 MED ORDER — LORATADINE 10 MG PO TABS: 10.0000 mg | ORAL_TABLET | Freq: Every day | ORAL | Status: DC

## 2012-05-07 MED ORDER — IBUPROFEN 400 MG PO TABS: 400.0000 mg | ORAL_TABLET | Freq: Four times a day (QID) | ORAL | Status: DC | PRN

## 2012-05-07 NOTE — ED Notes (Signed)
Pt c/o sore throat, headache, nausea and vomiting x 3 episodes in last 3 hours. Denies diarrhea

## 2012-05-07 NOTE — ED Provider Notes (Signed)
History     CSN: [id number]  Arrival date & time 05/07/12  0304   First MD Initiated Contact with Patient 05/07/12 0358      Chief Complaint  Patient presents with  . Sore Throat  . Nausea  . Emesis    (Consider location/radiation/quality/duration/timing/severity/associated sxs/prior treatment) Patient is a 32 y.o. male presenting with pharyngitis and URI. The history is provided by the patient. No language interpreter was used.  Sore Throat This is a new problem. The current episode started more than 2 days ago. The problem occurs constantly. The problem has not changed since onset.Pertinent negatives include no chest pain and no shortness of breath. Nothing aggravates the symptoms. Nothing relieves the symptoms. He has tried nothing for the symptoms. The treatment provided no relief.  URI Presenting symptoms: congestion, rhinorrhea and sore throat   Presenting symptoms: no ear pain and no fever   Congestion:    Location:  Nasal   Interferes with sleep: no     Interferes with eating/drinking: no   Sore throat:    Severity:  Moderate   Onset quality:  Gradual   Duration:  3 days   Timing:  Constant Severity:  Moderate Onset quality:  Gradual Timing:  Constant Progression:  Unchanged Chronicity:  New Relieved by:  Nothing Associated symptoms: myalgias   Associated symptoms: no neck pain and no swollen glands   Risk factors: not elderly     Past Medical History  Diagnosis Date  . MRSA (methicillin resistant staph aureus) culture positive   . GERD (gastroesophageal reflux disease)     History reviewed. No pertinent past surgical history.  No family history on file.  History  Substance Use Topics  . Smoking status: Never Smoker   . Smokeless tobacco: Not on file  . Alcohol Use: Yes      Review of Systems  Constitutional: Negative for fever.  HENT: Positive for congestion, sore throat and rhinorrhea. Negative for ear pain and neck pain.   Respiratory:  Negative for shortness of breath.   Cardiovascular: Negative for chest pain.  Musculoskeletal: Positive for myalgias.  All other systems reviewed and are negative.    Allergies  Bactrim; Cephalexin; and Vicodin  Home Medications   Current Outpatient Rx  Name  Route  Sig  Dispense  Refill  . guaiFENesin (ROBITUSSIN) 100 MG/5ML liquid   Oral   Take 200 mg by mouth 3 (three) times daily as needed for cough.         Marland Kitchen acetaminophen (TYLENOL) 500 MG tablet   Oral   Take by mouth every 6 (six) hours as needed. pain         . AMOXICILLIN PO   Oral   Take by mouth 3 (three) times daily.         . diphenhydrAMINE (BENADRYL) 25 MG tablet   Oral   Take 25 mg by mouth every 6 (six) hours as needed. allergy         . doxycycline (VIBRAMYCIN) 100 MG capsule   Oral   Take 1 capsule (100 mg total) by mouth 2 (two) times daily.   10 capsule   0   . Flaxseed, Linseed, (FLAXSEED OIL PO)   Oral   Take 1 capsule by mouth 3 (three) times daily.          . hydrOXYzine (VISTARIL) 50 MG capsule   Oral   Take 50 mg by mouth as needed.         Marland Kitchen  Oxycodone-Acetaminophen (PERCOCET PO)   Oral   Take 1 tablet by mouth daily as needed. For pain.         Marland Kitchen oxyCODONE-acetaminophen (PERCOCET/ROXICET) 5-325 MG per tablet   Oral   Take 1-2 tablets by mouth every 6 (six) hours as needed for pain.   15 tablet   0   . ranitidine (ZANTAC) 150 MG tablet   Oral   Take 150 mg by mouth 2 (two) times daily.             BP 124/65  Pulse 77  Temp(Src) 98.5 F (36.9 C) (Oral)  Resp 18  Ht 6\' 3"  (1.905 m)  Wt 260 lb (117.935 kg)  BMI 32.5 kg/m2  SpO2 99%  Physical Exam  Constitutional: He is oriented to person, place, and time. He appears well-developed and well-nourished. No distress.  HENT:  Head: Normocephalic.  Right Ear: No mastoid tenderness. Tympanic membrane is not injected.  Left Ear: No mastoid tenderness. Tympanic membrane is not injected.  Mouth/Throat:  Oropharynx is clear and moist. No oropharyngeal exudate.  No pain with movement of the trachea  Eyes: Conjunctivae and EOM are normal. Pupils are equal, round, and reactive to light.  Neck: Normal range of motion. Neck supple. No tracheal deviation present.  Cardiovascular: Normal rate, regular rhythm and intact distal pulses.   Pulmonary/Chest: Effort normal and breath sounds normal. No stridor. No respiratory distress. He has no wheezes. He has no rales.  Abdominal: Soft. Bowel sounds are normal. There is no tenderness. There is no rebound and no guarding.  Musculoskeletal: Normal range of motion.  Lymphadenopathy:    He has no cervical adenopathy.  Neurological: He is alert and oriented to person, place, and time.  Skin: Skin is warm. No rash noted.  Psychiatric: He has a normal mood and affect.    ED Course  Procedures (including critical care time)  Labs Reviewed  RAPID STREP SCREEN   No results found.   No diagnosis found.    MDM  Will treat for URI.  Return for difficulty swallowing or any concerns        Poetry Cerro K Adden Strout-Rasch, MD 05/07/12 9604

## 2012-07-15 ENCOUNTER — Encounter (HOSPITAL_BASED_OUTPATIENT_CLINIC_OR_DEPARTMENT_OTHER): Payer: Self-pay | Admitting: Emergency Medicine

## 2012-07-15 ENCOUNTER — Emergency Department (HOSPITAL_BASED_OUTPATIENT_CLINIC_OR_DEPARTMENT_OTHER): Payer: Self-pay

## 2012-07-15 ENCOUNTER — Emergency Department (HOSPITAL_BASED_OUTPATIENT_CLINIC_OR_DEPARTMENT_OTHER)
Admission: EM | Admit: 2012-07-15 | Discharge: 2012-07-15 | Disposition: A | Discharge status: Home / Self Care | Payer: Self-pay | Attending: Emergency Medicine | Admitting: Emergency Medicine

## 2012-07-15 DIAGNOSIS — Z792 Long term (current) use of antibiotics: Secondary | ICD-10-CM | POA: Insufficient documentation

## 2012-07-15 DIAGNOSIS — Z79899 Other long term (current) drug therapy: Secondary | ICD-10-CM | POA: Insufficient documentation

## 2012-07-15 DIAGNOSIS — L03032 Cellulitis of left toe: Secondary | ICD-10-CM

## 2012-07-15 DIAGNOSIS — L03039 Cellulitis of unspecified toe: Secondary | ICD-10-CM | POA: Insufficient documentation

## 2012-07-15 DIAGNOSIS — Z8614 Personal history of Methicillin resistant Staphylococcus aureus infection: Secondary | ICD-10-CM | POA: Insufficient documentation

## 2012-07-15 DIAGNOSIS — K219 Gastro-esophageal reflux disease without esophagitis: Secondary | ICD-10-CM | POA: Insufficient documentation

## 2012-07-15 MED ORDER — IBUPROFEN 600 MG PO TABS: 600.0000 mg | ORAL_TABLET | Freq: Four times a day (QID) | ORAL | Status: DC | PRN

## 2012-07-15 MED ORDER — CLINDAMYCIN HCL 150 MG PO CAPS: 150.0000 mg | ORAL_CAPSULE | Freq: Four times a day (QID) | ORAL | Status: DC

## 2012-07-15 MED ORDER — LIDOCAINE HCL 2 % EX GEL
CUTANEOUS | Status: AC
  Filled 2012-07-15: qty 20

## 2012-07-15 NOTE — ED Notes (Signed)
I&D complete bactrim oint. and bulk sterile drsg Left great toe applied Post-op shoe inplace

## 2012-07-15 NOTE — ED Notes (Signed)
Pt left foot placed in sterile saline and betadine soak per order. I&D tray with lido at bedside

## 2012-07-15 NOTE — ED Provider Notes (Signed)
History     CSN: [id number]  Arrival date & time 07/15/12  0406   First MD Initiated Contact with Patient 07/15/12 [phone]      Chief Complaint  Patient presents with  . Foot Pain    (Consider location/radiation/quality/duration/timing/severity/associated sxs/prior treatment) Patient is a 32 y.o. male presenting with lower extremity pain. The history is provided by the patient. No language interpreter was used.  Foot Pain This is a recurrent problem. The current episode started more than 1 week ago (3 weeks ago). The problem occurs constantly. The problem has been gradually worsening. Pertinent negatives include no chest pain, no abdominal pain, no headaches and no shortness of breath. Nothing aggravates the symptoms. Nothing relieves the symptoms. Treatments tried: soaks and topical antibiotic cream. The treatment provided no relief.  Removed his own ingrown tow nail and now has pain, swelling and drainage along the cuticle.  No trauma  Past Medical History  Diagnosis Date  . MRSA (methicillin resistant staph aureus) culture positive   . GERD (gastroesophageal reflux disease)     History reviewed. No pertinent past surgical history.  No family history on file.  History  Substance Use Topics  . Smoking status: Never Smoker   . Smokeless tobacco: Not on file  . Alcohol Use: Yes      Review of Systems  Respiratory: Negative for shortness of breath.   Cardiovascular: Negative for chest pain.  Gastrointestinal: Negative for abdominal pain.  Neurological: Negative for headaches.  All other systems reviewed and are negative.    Allergies  Bactrim; Cephalexin; and Vicodin  Home Medications   Current Outpatient Rx  Name  Route  Sig  Dispense  Refill  . acetaminophen (TYLENOL) 500 MG tablet   Oral   Take by mouth every 6 (six) hours as needed. pain         . AMOXICILLIN PO   Oral   Take by mouth 3 (three) times daily.         . diphenhydrAMINE (BENADRYL) 25 MG  tablet   Oral   Take 25 mg by mouth every 6 (six) hours as needed. allergy         . doxycycline (VIBRAMYCIN) 100 MG capsule   Oral   Take 1 capsule (100 mg total) by mouth 2 (two) times daily.   10 capsule   0   . Flaxseed, Linseed, (FLAXSEED OIL PO)   Oral   Take 1 capsule by mouth 3 (three) times daily.          Marland Kitchen guaiFENesin (ROBITUSSIN) 100 MG/5ML liquid   Oral   Take 200 mg by mouth 3 (three) times daily as needed for cough.         . hydrOXYzine (VISTARIL) 50 MG capsule   Oral   Take 50 mg by mouth as needed.         Marland Kitchen ibuprofen (ADVIL,MOTRIN) 400 MG tablet   Oral   Take 1 tablet (400 mg total) by mouth every 6 (six) hours as needed for pain.   30 tablet   0   . loratadine (CLARITIN) 10 MG tablet   Oral   Take 1 tablet (10 mg total) by mouth daily.   7 tablet   0   . Oxycodone-Acetaminophen (PERCOCET PO)   Oral   Take 1 tablet by mouth daily as needed. For pain.         Marland Kitchen oxyCODONE-acetaminophen (PERCOCET/ROXICET) 5-325 MG per tablet   Oral   Take 1-2 tablets  by mouth every 6 (six) hours as needed for pain.   15 tablet   0   . ranitidine (ZANTAC) 150 MG tablet   Oral   Take 150 mg by mouth 2 (two) times daily.             BP 128/82  Pulse 76  Temp(Src) 98.6 F (37 C) (Oral)  Resp 20  Ht 6\' 3"  (1.905 m)  Wt 260 lb (117.935 kg)  BMI 32.5 kg/m2  SpO2 100%  Physical Exam  Constitutional: He is oriented to person, place, and time. He appears well-developed and well-nourished. No distress.  HENT:  Head: Normocephalic and atraumatic.  Mouth/Throat: Oropharynx is clear and moist.  Eyes: Conjunctivae are normal. Pupils are equal, round, and reactive to light.  Neck: Normal range of motion. Neck supple.  Cardiovascular: Normal rate, regular rhythm and intact distal pulses.   Pulmonary/Chest: Effort normal and breath sounds normal. He has no wheezes. He has no rales.  Abdominal: Soft. Bowel sounds are normal. There is no tenderness. There  is no rebound and no guarding.  Musculoskeletal: Normal range of motion. He exhibits tenderness.       Feet:  Tenderness of the cuticle of the left great toe surrounding the nail  Neurological: He is alert and oriented to person, place, and time.  Skin: Skin is warm and dry.  Psychiatric: He has a normal mood and affect.    ED Course  INCISION AND DRAINAGE Date/Time: 07/15/2012 5:52 AM Performed by: Jasmine Awe Authorized by: Jasmine Awe Consent: Verbal consent obtained. Patient understanding: patient states understanding of the procedure being performed Patient identity confirmed: arm band Indications for incision and drainage: paronychia of the left great toe. Body area: lower extremity Location details: left big toe Anesthesia method: viscus lidocain topically. Scalpel size: 11 Incision type: single straight Drainage: purulent Drainage amount: moderate   (including critical care time)  Labs Reviewed - No data to display Dg Foot Complete Left  07/15/2012  *RADIOLOGY REPORT*  Clinical Data: Foot pain  LEFT FOOT - COMPLETE 3+ VIEW  Comparison: 01/21/2012  Findings: No fracture or dislocation.  No aggressive osseous lesions.  No radiopaque foreign body. Decreased dorsal soft tissue swelling as compared to the prior.  IMPRESSION: No acute osseous abnormality.   Original Report Authenticated By: Jearld Lesch, M.D.      No diagnosis found.    MDM  Clindamycin 300 mg PO QID x 7 days and ibuprofen follow up with podiatry.  Return for worsening symptoms        Evan Osburn K Bensen Chadderdon-Rasch, MD 07/15/12 [phone]

## 2012-07-15 NOTE — ED Notes (Signed)
Pt c/o left foot pain and infection? to left great toe x 3 weeks. Pt reports no relief with soaks & antibiotic ointment.

## 2012-08-30 DIAGNOSIS — F333 Major depressive disorder, recurrent, severe with psychotic symptoms: Secondary | ICD-10-CM | POA: Insufficient documentation

## 2012-08-30 DIAGNOSIS — F411 Generalized anxiety disorder: Secondary | ICD-10-CM | POA: Insufficient documentation

## 2012-10-15 ENCOUNTER — Emergency Department (HOSPITAL_BASED_OUTPATIENT_CLINIC_OR_DEPARTMENT_OTHER)
Admission: EM | Admit: 2012-10-15 | Discharge: 2012-10-15 | Disposition: A | Discharge status: Home / Self Care | Payer: No Typology Code available for payment source | Attending: Emergency Medicine | Admitting: Emergency Medicine

## 2012-10-15 ENCOUNTER — Encounter (HOSPITAL_BASED_OUTPATIENT_CLINIC_OR_DEPARTMENT_OTHER): Payer: Self-pay

## 2012-10-15 DIAGNOSIS — K219 Gastro-esophageal reflux disease without esophagitis: Secondary | ICD-10-CM | POA: Insufficient documentation

## 2012-10-15 DIAGNOSIS — R42 Dizziness and giddiness: Secondary | ICD-10-CM | POA: Insufficient documentation

## 2012-10-15 DIAGNOSIS — F411 Generalized anxiety disorder: Secondary | ICD-10-CM | POA: Insufficient documentation

## 2012-10-15 DIAGNOSIS — R110 Nausea: Secondary | ICD-10-CM | POA: Insufficient documentation

## 2012-10-15 DIAGNOSIS — Z8614 Personal history of Methicillin resistant Staphylococcus aureus infection: Secondary | ICD-10-CM | POA: Insufficient documentation

## 2012-10-15 DIAGNOSIS — R5381 Other malaise: Secondary | ICD-10-CM | POA: Insufficient documentation

## 2012-10-15 DIAGNOSIS — R5383 Other fatigue: Secondary | ICD-10-CM | POA: Insufficient documentation

## 2012-10-15 DIAGNOSIS — G43909 Migraine, unspecified, not intractable, without status migrainosus: Secondary | ICD-10-CM | POA: Insufficient documentation

## 2012-10-15 DIAGNOSIS — Z79899 Other long term (current) drug therapy: Secondary | ICD-10-CM | POA: Insufficient documentation

## 2012-10-15 HISTORY — DX: Anxiety disorder, unspecified: F41.9

## 2012-10-15 LAB — GLUCOSE, CAPILLARY: Glucose-Capillary: 84 mg/dL (ref 70–99)

## 2012-10-15 LAB — CSF CELL COUNT WITH DIFFERENTIAL
RBC Count, CSF: 0 /mm3
Tube #: 4
WBC, CSF: 1 /mm3 (ref 0–5)

## 2012-10-15 MED ORDER — DIPHENHYDRAMINE HCL 50 MG/ML IJ SOLN
25.0000 mg | Freq: Once | INTRAMUSCULAR | Status: AC
  Administered 2012-10-15: 25 mg via INTRAVENOUS
  Filled 2012-10-15: qty 1

## 2012-10-15 MED ORDER — SODIUM CHLORIDE 0.9 % IV BOLUS (SEPSIS)
1000.0000 mL | Freq: Once | INTRAVENOUS | Status: AC
  Administered 2012-10-15: 1000 mL via INTRAVENOUS

## 2012-10-15 MED ORDER — METOCLOPRAMIDE HCL 5 MG/ML IJ SOLN
10.0000 mg | Freq: Once | INTRAMUSCULAR | Status: AC
  Administered 2012-10-15: 10 mg via INTRAVENOUS
  Filled 2012-10-15: qty 2

## 2012-10-15 MED ORDER — DEXAMETHASONE SODIUM PHOSPHATE 10 MG/ML IJ SOLN
10.0000 mg | Freq: Once | INTRAMUSCULAR | Status: AC
  Administered 2012-10-15: 10 mg via INTRAVENOUS
  Filled 2012-10-15: qty 1

## 2012-10-15 NOTE — ED Notes (Addendum)
[facility] contacted by Idalia Needle, nsg sec for recent records-pt concerned BS low-CBG done

## 2012-10-15 NOTE — ED Provider Notes (Deleted)
Patient presents with headache after an episode of vomiting a week ago. He states this is the worst headache of his life. He is well-appearing with no vomiting or photophobia. He's afebrile. Given that this started after vomiting and at least claims this is the worst headache of his life I did feel that an LP was indicated. I discussed with the patient and he wants to proceed. He had a recent head CT with the same headache and [facility] that was normal.    Rolan Bucco, MD 10/15/12 [phone]

## 2012-10-15 NOTE — ED Notes (Signed)
C/o bilateral temporal HA x 1 week-started after a night of heavy drinking-HA has cont'd-was see at [facility] Sat-reports he had blood work and CT scan-dx home with no meds and to f/u with PCP-PCP unable to recheck for 4 weeks-pt with steady gait to tx area-NAD at present

## 2012-10-15 NOTE — ED Provider Notes (Signed)
  Physical Exam  BP 140/79  Pulse 71  Temp(Src) 98.5 F (36.9 C) (Oral)  Resp 16  Ht 6\' 3"  (1.905 m)  Wt 260 lb (117.935 kg)  BMI 32.5 kg/m2  SpO2 97%  Physical Exam  ED Course  LUMBAR PUNCTURE Date/Time: 10/15/2012 4:20 PM Performed by: Nyia Tsao Authorized by: Rolan Bucco Consent: written consent obtained. Risks and benefits: risks, benefits and alternatives were discussed Consent given by: patient Patient understanding: patient states understanding of the procedure being performed Patient consent: the patient's understanding of the procedure matches consent given Procedure consent: procedure consent matches procedure scheduled Relevant documents: relevant documents present and verified Test results: test results available and properly labeled Imaging studies: imaging studies available Patient identity confirmed: verbally with patient Time out: Immediately prior to procedure a "time out" was called to verify the correct patient, procedure, equipment, support staff and site/side marked as required. Indications: evaluation for subarachnoid hemorrhage Anesthesia: local infiltration Local anesthetic: lidocaine 1% without epinephrine Anesthetic total: 2 ml Patient sedated: no Preparation: Patient was prepped and draped in the usual sterile fashion. Lumbar space: L3-L4 interspace Patient's position: sitting Needle gauge: 22 Needle length: 3.5 in Number of attempts: 1 Fluid appearance: clear Tubes of fluid: 4 Total volume: 4 ml Patient tolerance: Patient tolerated the procedure well with no immediate complications.    MDM Patient presents with headache after an episode of vomiting a week ago. He states this is the worst headache of his life. He is well-appearing with no vomiting or photophobia. He's afebrile. Given that this started after vomiting and at least claims this is the worst headache of his life I did feel that an LP was indicated. I discussed with the  patient and he wants to proceed. He had a recent head CT with the same headache and [facility] that was normal.         Rolan Bucco, MD 10/15/12 [phone]

## 2012-10-15 NOTE — ED Provider Notes (Signed)
History    CSN: [id number] Arrival date & time 10/15/12  1242  First MD Initiated Contact with Patient 10/15/12 1342     Chief Complaint  Patient presents with  . Headache   (Consider location/radiation/quality/duration/timing/severity/associated sxs/prior Treatment) The history is provided by the patient. No language interpreter was used.  Bryan Gill is a 32 y/o M with PMHx of MRSA, GERD, Anxiety presenting to the ED with headache that has been ongoing x 1 week. Patient reported that the headache is described as a pressure behind the ears and eyes with a gripping sensation all over the head. Patient reported that the pain radiates to his face and back of his neck. Stated that laying down makes the pain worse and that nothing makes the pain better. Patient reported that he has been using Tylenol and Ibuprofen as needed without relief. Patient reported that he feels facial numbness and tingling. Stated that he has never experienced a headache like this before - stated that he has had migraines in the past and this is the worst headache that he has ever had. Reported that he has been feeling nauseous, photophobia, phonophobia, mild blurred vision. Stated that he has had migraines in the past with blurred vision, floaters, photophobia, phonophobia - stated that the intensity is much greater than normal. Patient reported that he went to [facility] this weekend where blood work and CT were performed and was discharged to take Tylenol. Patient reported that he tried to schedule an appointment with his PCP, [facility], but stated that he cannot be seen until 4 weeks for new patients. Denied chest pain, shortness of breath, difficulty breathing, diarrhea, abdominal pain, facial drooping, speech difficulty, sudden loss of vision.  PCP [facility] - new patient.   Past Medical History  Diagnosis Date  . MRSA (methicillin resistant staph aureus) culture positive   . GERD (gastroesophageal  reflux disease)   . Anxiety    History reviewed. No pertinent past surgical history. No family history on file. History  Substance Use Topics  . Smoking status: Never Smoker   . Smokeless tobacco: Not on file  . Alcohol Use: Yes    Review of Systems  Constitutional: Positive for fatigue. Negative for fever and chills.  HENT: Negative for trouble swallowing and neck pain.   Respiratory: Negative for chest tightness and shortness of breath.   Cardiovascular: Negative for chest pain.  Gastrointestinal: Positive for nausea. Negative for vomiting, abdominal pain and diarrhea.  Neurological: Positive for dizziness and headaches. Negative for speech difficulty, weakness and light-headedness.  All other systems reviewed and are negative.    Allergies  Bactrim; Cephalexin; and Vicodin  Home Medications   Current Outpatient Rx  Name  Route  Sig  Dispense  Refill  . acetaminophen (TYLENOL) 500 MG tablet   Oral   Take by mouth every 6 (six) hours as needed. pain         . AMOXICILLIN PO   Oral   Take by mouth 3 (three) times daily.         . clindamycin (CLEOCIN) 150 MG capsule   Oral   Take 1 capsule (150 mg total) by mouth every 6 (six) hours.   28 capsule   0   . clindamycin (CLEOCIN) 150 MG capsule   Oral   Take 1 capsule (150 mg total) by mouth every 6 (six) hours.   28 capsule   0   . diphenhydrAMINE (BENADRYL) 25 MG tablet   Oral  Take 25 mg by mouth every 6 (six) hours as needed. allergy         . doxycycline (VIBRAMYCIN) 100 MG capsule   Oral   Take 1 capsule (100 mg total) by mouth 2 (two) times daily.   10 capsule   0   . Flaxseed, Linseed, (FLAXSEED OIL PO)   Oral   Take 1 capsule by mouth 3 (three) times daily.          Marland Kitchen guaiFENesin (ROBITUSSIN) 100 MG/5ML liquid   Oral   Take 200 mg by mouth 3 (three) times daily as needed for cough.         . hydrOXYzine (VISTARIL) 50 MG capsule   Oral   Take 50 mg by mouth as needed.         Marland Kitchen  ibuprofen (ADVIL,MOTRIN) 400 MG tablet   Oral   Take 1 tablet (400 mg total) by mouth every 6 (six) hours as needed for pain.   30 tablet   0   . ibuprofen (ADVIL,MOTRIN) 600 MG tablet   Oral   Take 1 tablet (600 mg total) by mouth every 6 (six) hours as needed for pain.   30 tablet   0   . loratadine (CLARITIN) 10 MG tablet   Oral   Take 1 tablet (10 mg total) by mouth daily.   7 tablet   0   . Oxycodone-Acetaminophen (PERCOCET PO)   Oral   Take 1 tablet by mouth daily as needed. For pain.         Marland Kitchen oxyCODONE-acetaminophen (PERCOCET/ROXICET) 5-325 MG per tablet   Oral   Take 1-2 tablets by mouth every 6 (six) hours as needed for pain.   15 tablet   0   . ranitidine (ZANTAC) 150 MG tablet   Oral   Take 150 mg by mouth 2 (two) times daily.            BP 130/70  Pulse 74  Temp(Src) 98.5 F (36.9 C) (Oral)  Resp 20  Ht 6\' 3"  (1.905 m)  Wt 260 lb (117.935 kg)  BMI 32.5 kg/m2  SpO2 100% Physical Exam  Nursing note and vitals reviewed. Constitutional: He is oriented to person, place, and time. He appears well-developed and well-nourished. No distress.  HENT:  Head: Normocephalic and atraumatic.  Eyes: Conjunctivae and EOM are normal. Pupils are equal, round, and reactive to light. Right eye exhibits no discharge. Left eye exhibits no discharge.  Neck: Normal range of motion. Neck supple.  Mild discomfort noted to palpation of the neck   Cardiovascular: Normal rate, regular rhythm and normal heart sounds.  Exam reveals no friction rub.   No murmur heard. Pulses:      Radial pulses are 2+ on the right side, and 2+ on the left side.       Dorsalis pedis pulses are 2+ on the right side, and 2+ on the left side.  Pulmonary/Chest: Effort normal and breath sounds normal. No respiratory distress. He has no wheezes. He has no rales.  Neurological: He is alert and oriented to person, place, and time. No cranial nerve deficit or sensory deficit. He exhibits normal muscle  tone. Coordination normal. GCS eye subscore is 4. GCS verbal subscore is 5. GCS motor subscore is 6.  Cranial nerves III-XII grossly intact  Strength 5+/5+ with resistance  Gait steady and proper - without sway Negative Romberg Patient able to walk on tippy toes and heels Dizziness reported when patient bent over  to touch toes and slowly rose back up  Skin: Skin is warm and dry. No rash noted. He is not diaphoretic. No erythema.  Psychiatric: He has a normal mood and affect. His behavior is normal. Thought content normal.    ED Course  Procedures (including critical care time)  3:15PM Dr. Fredderick Phenix saw and assessed patient - LP to be performed.   6:35PM Discussed findnigs and labs with patient in length. Patient reported that the headache has improved, down to a 6/10. Denied dizziness and nausea. Patient ready to go home.    Labs Reviewed  CSF CELL COUNT WITH DIFFERENTIAL - Abnormal; Notable for the following:    RBC Count, CSF 24 (*)    All other components within normal limits  CSF CULTURE  GLUCOSE, CAPILLARY  GLUCOSE, CSF  CSF CELL COUNT WITH DIFFERENTIAL  PROTEIN, CSF   No results found. 1. Migraine     MDM  Patient presenting to ED with "worst headache" that he has ever experienced. Patient stated that he has been having this headache for approximately one week with dizziness, blurred vision, constant discomfort. Patient seen in [facility] for same complaint - work-up performed. Record received from [facility] and reviewed - CT scan of head negative for intracranial injuries or mass affects, blood work negative findings.  Negative neurological deficits noted. Pulses palpable. Full ROM to the upper and lower extremities. Gait proper and steady. Negative facial drooping, slurred speech. Mild discomfort upon palpation to the neck - supple.   Discussed case with Dr. Ardeen Jourdain - saw and assessed patient - recommended LP to be performed. Dr. Ardeen Jourdain performed LP - patient tolerated  procedure well.  LP negative findings - no sign of RBC - negative SAH. Patient suffering from severe migraine headache. Patient's pain controlled in ED setting. Doubt meningeal source. Patient stable, afebrile. Discharged patient with referral to neurology. Discussed with patient to rest and stay hydrated. Discussed with patient to use Excedrin. Discussed with patient post-LP headaches. Discussed with patient to continue to monitor symtpoms and if symptoms are to worsen or change to report back to the ED - strict return instructions given.  Patient agreed to plan of care, understood, all questions answered.   Raymon Mutton, [name] 10/15/12 2347

## 2012-10-17 NOTE — ED Provider Notes (Signed)
Medical screening examination/treatment/procedure(s) were conducted as a shared visit with non-physician practitioner(s) and myself.  I personally evaluated the patient during the encounter   Rolan Bucco, MD 10/17/12 [phone]

## 2012-10-19 LAB — CSF CULTURE W GRAM STAIN
Culture: NO GROWTH
Special Requests: NORMAL

## 2013-01-15 ENCOUNTER — Encounter (HOSPITAL_BASED_OUTPATIENT_CLINIC_OR_DEPARTMENT_OTHER): Payer: Self-pay | Admitting: Emergency Medicine

## 2013-01-15 ENCOUNTER — Emergency Department (HOSPITAL_BASED_OUTPATIENT_CLINIC_OR_DEPARTMENT_OTHER)
Admission: EM | Admit: 2013-01-15 | Discharge: 2013-01-15 | Disposition: A | Discharge status: Home / Self Care | Payer: No Typology Code available for payment source | Attending: Emergency Medicine | Admitting: Emergency Medicine

## 2013-01-15 DIAGNOSIS — F411 Generalized anxiety disorder: Secondary | ICD-10-CM | POA: Insufficient documentation

## 2013-01-15 DIAGNOSIS — Z8614 Personal history of Methicillin resistant Staphylococcus aureus infection: Secondary | ICD-10-CM | POA: Insufficient documentation

## 2013-01-15 DIAGNOSIS — K219 Gastro-esophageal reflux disease without esophagitis: Secondary | ICD-10-CM | POA: Insufficient documentation

## 2013-01-15 DIAGNOSIS — R300 Dysuria: Secondary | ICD-10-CM | POA: Insufficient documentation

## 2013-01-15 DIAGNOSIS — Z79899 Other long term (current) drug therapy: Secondary | ICD-10-CM | POA: Insufficient documentation

## 2013-01-15 LAB — URINALYSIS, ROUTINE W REFLEX MICROSCOPIC
Glucose, UA: NEGATIVE mg/dL
Leukocytes, UA: NEGATIVE
Nitrite: NEGATIVE
Protein, ur: NEGATIVE mg/dL
Urobilinogen, UA: 0.2 mg/dL (ref 0.0–1.0)

## 2013-01-15 LAB — GC/CHLAMYDIA PROBE AMP: CT Probe RNA: NEGATIVE

## 2013-01-15 MED ORDER — AZITHROMYCIN 1 G PO PACK
2.0000 g | PACK | Freq: Once | ORAL | Status: AC
  Administered 2013-01-15: 2 g via ORAL
  Filled 2013-01-15: qty 2

## 2013-01-15 NOTE — ED Provider Notes (Addendum)
CSN: [id number]     Arrival date & time 01/15/13  0231 History   First MD Initiated Contact with Patient 01/15/13 0241     Chief Complaint  Patient presents with  . Dysuria   (Consider location/radiation/quality/duration/timing/severity/associated sxs/prior Treatment) HPI This is a 32 year old male who is a three-day history of mild burning at the end of his penis when he urinates. He is also having some itching at the base of his penis and his scrotum. He is concerned that he may have contracted an STD. He has not noted any lice but is concerned he may have crabs. He attributes this to staying in a low quality hotel recently. He denies unprotected sex.  Past Medical History  Diagnosis Date  . MRSA (methicillin resistant staph aureus) culture positive   . GERD (gastroesophageal reflux disease)   . Anxiety    History reviewed. No pertinent past surgical history. No family history on file. History  Substance Use Topics  . Smoking status: Never Smoker   . Smokeless tobacco: Not on file  . Alcohol Use: Yes    Review of Systems  All other systems reviewed and are negative.    Allergies  Bactrim; Cephalexin; and Vicodin  Home Medications   Current Outpatient Rx  Name  Route  Sig  Dispense  Refill  . acetaminophen (TYLENOL) 500 MG tablet   Oral   Take by mouth every 6 (six) hours as needed. pain         . AMOXICILLIN PO   Oral   Take by mouth 3 (three) times daily.         . clindamycin (CLEOCIN) 150 MG capsule   Oral   Take 1 capsule (150 mg total) by mouth every 6 (six) hours.   28 capsule   0   . clindamycin (CLEOCIN) 150 MG capsule   Oral   Take 1 capsule (150 mg total) by mouth every 6 (six) hours.   28 capsule   0   . diphenhydrAMINE (BENADRYL) 25 MG tablet   Oral   Take 25 mg by mouth every 6 (six) hours as needed. allergy         . doxycycline (VIBRAMYCIN) 100 MG capsule   Oral   Take 1 capsule (100 mg total) by mouth 2 (two) times daily.   10  capsule   0   . Flaxseed, Linseed, (FLAXSEED OIL PO)   Oral   Take 1 capsule by mouth 3 (three) times daily.          Marland Kitchen guaiFENesin (ROBITUSSIN) 100 MG/5ML liquid   Oral   Take 200 mg by mouth 3 (three) times daily as needed for cough.         . hydrOXYzine (VISTARIL) 50 MG capsule   Oral   Take 50 mg by mouth as needed.         Marland Kitchen ibuprofen (ADVIL,MOTRIN) 400 MG tablet   Oral   Take 1 tablet (400 mg total) by mouth every 6 (six) hours as needed for pain.   30 tablet   0   . ibuprofen (ADVIL,MOTRIN) 600 MG tablet   Oral   Take 1 tablet (600 mg total) by mouth every 6 (six) hours as needed for pain.   30 tablet   0   . loratadine (CLARITIN) 10 MG tablet   Oral   Take 1 tablet (10 mg total) by mouth daily.   7 tablet   0   . Oxycodone-Acetaminophen (PERCOCET PO)  Oral   Take 1 tablet by mouth daily as needed. For pain.         Marland Kitchen oxyCODONE-acetaminophen (PERCOCET/ROXICET) 5-325 MG per tablet   Oral   Take 1-2 tablets by mouth every 6 (six) hours as needed for pain.   15 tablet   0   . ranitidine (ZANTAC) 150 MG tablet   Oral   Take 150 mg by mouth 2 (two) times daily.            BP 144/78  Pulse 78  Temp(Src) 98.9 F (37.2 C) (Oral)  Resp 18  Ht 6\' 3"  (1.905 m)  Wt 262 lb (118.842 kg)  BMI 32.75 kg/m2  SpO2 98%  Physical Exam General: Well-developed, well-nourished male in no acute distress; appearance consistent with age of record HENT: normocephalic; atraumatic Eyes: Normal appearance Neck: supple Heart: regular rate and rhythm; no murmurs, rubs or gallops Lungs: Normal respiratory effort and excursion Abdomen: soft; nondistended; nontender; no masses or hepatosplenomegaly GU: Tanner 4 male, circumcised; no urethral discharge; no testicular mass or tenderness; no crab lice seen Extremities: No deformity; full range of motion Neurologic: Awake, alert and oriented; motor function intact in all extremities and symmetric; no facial droop Skin:  Warm and dry Psychiatric: Normal mood and affect    ED Course  Procedures (including critical care time)   MDM  Patient prefers to be treated at this time for possible STD. He was advised that lice treatment may be purchased over-the-counter without prescription.  Nursing notes and vitals signs, including pulse oximetry, reviewed.  Summary of this visit's results, reviewed by myself:  Labs:  Results for orders placed during the hospital encounter of 01/15/13 (from the past 24 hour(s))  URINALYSIS, ROUTINE W REFLEX MICROSCOPIC     Status: None   Collection Time    01/15/13  2:42 AM      Result Value Range   Color, Urine YELLOW  YELLOW   APPearance CLEAR  CLEAR   Specific Gravity, Urine 1.020  1.005 - 1.030   pH 6.0  5.0 - 8.0   Glucose, UA NEGATIVE  NEGATIVE mg/dL   Hgb urine dipstick NEGATIVE  NEGATIVE   Bilirubin Urine NEGATIVE  NEGATIVE   Ketones, ur NEGATIVE  NEGATIVE mg/dL   Protein, ur NEGATIVE  NEGATIVE mg/dL   Urobilinogen, UA 0.2  0.0 - 1.0 mg/dL   Nitrite NEGATIVE  NEGATIVE   Leukocytes, UA NEGATIVE  NEGATIVE    Hanley Seamen, MD 01/15/13 0250  Carlisle Beers Larz Mark, MD 01/15/13 0300

## 2013-01-15 NOTE — ED Notes (Signed)
Pt c/o itching in his genital area and irritation when he urinates since Monday. Pt denies discharge.

## 2013-01-17 ENCOUNTER — Telehealth (HOSPITAL_COMMUNITY): Payer: Self-pay

## 2013-01-17 NOTE — ED Notes (Signed)
Pt calling for STD results.  ID verified x 2. Pt informed both gonorrhea and chlamydia are negative.   

## 2013-02-28 ENCOUNTER — Emergency Department (HOSPITAL_BASED_OUTPATIENT_CLINIC_OR_DEPARTMENT_OTHER): Payer: Self-pay

## 2013-02-28 ENCOUNTER — Encounter (HOSPITAL_BASED_OUTPATIENT_CLINIC_OR_DEPARTMENT_OTHER): Payer: Self-pay | Admitting: Emergency Medicine

## 2013-02-28 ENCOUNTER — Emergency Department (HOSPITAL_BASED_OUTPATIENT_CLINIC_OR_DEPARTMENT_OTHER)
Admission: EM | Admit: 2013-02-28 | Discharge: 2013-02-28 | Disposition: A | Discharge status: Home / Self Care | Payer: Self-pay | Attending: Emergency Medicine | Admitting: Emergency Medicine

## 2013-02-28 DIAGNOSIS — K219 Gastro-esophageal reflux disease without esophagitis: Secondary | ICD-10-CM | POA: Insufficient documentation

## 2013-02-28 DIAGNOSIS — R110 Nausea: Secondary | ICD-10-CM | POA: Insufficient documentation

## 2013-02-28 DIAGNOSIS — K5900 Constipation, unspecified: Secondary | ICD-10-CM | POA: Insufficient documentation

## 2013-02-28 DIAGNOSIS — Z79899 Other long term (current) drug therapy: Secondary | ICD-10-CM | POA: Insufficient documentation

## 2013-02-28 DIAGNOSIS — F411 Generalized anxiety disorder: Secondary | ICD-10-CM | POA: Insufficient documentation

## 2013-02-28 DIAGNOSIS — Z8614 Personal history of Methicillin resistant Staphylococcus aureus infection: Secondary | ICD-10-CM | POA: Insufficient documentation

## 2013-02-28 MED ORDER — POLYETHYLENE GLYCOL 3350 17 G PO PACK: PACK | ORAL | Status: DC

## 2013-02-28 NOTE — ED Provider Notes (Signed)
CSN: [id number]     Arrival date & time 02/28/13  0148 History   First MD Initiated Contact with Patient 02/28/13 0308     Chief Complaint  Patient presents with  . Abdominal Pain   (Consider location/radiation/quality/duration/timing/severity/associated sxs/prior Treatment) HPI This is a 32 year old male who complains of lower abdominal pain and constipation for the past 2 weeks. It is worse in the past 2 days. He is accompanied by nausea but no vomiting. His abdomen is nondistended. She has taken prune juice and over-the-counter acid reflux medication without relief. The pain is worse with movement or palpation. He denies dysuria or difficulty urinating. His constipation has worsened and he is only passing small amounts of stool at the time.  Past Medical History  Diagnosis Date  . MRSA (methicillin resistant staph aureus) culture positive   . GERD (gastroesophageal reflux disease)   . Anxiety    History reviewed. No pertinent past surgical history. History reviewed. No pertinent family history. History  Substance Use Topics  . Smoking status: Never Smoker   . Smokeless tobacco: Not on file  . Alcohol Use: Yes    Review of Systems  All other systems reviewed and are negative.    Allergies  Bactrim; Cephalexin; and Vicodin  Home Medications   Current Outpatient Rx  Name  Route  Sig  Dispense  Refill  . acetaminophen (TYLENOL) 500 MG tablet   Oral   Take by mouth every 6 (six) hours as needed. pain         . AMOXICILLIN PO   Oral   Take by mouth 3 (three) times daily.         . clindamycin (CLEOCIN) 150 MG capsule   Oral   Take 1 capsule (150 mg total) by mouth every 6 (six) hours.   28 capsule   0   . clindamycin (CLEOCIN) 150 MG capsule   Oral   Take 1 capsule (150 mg total) by mouth every 6 (six) hours.   28 capsule   0   . diphenhydrAMINE (BENADRYL) 25 MG tablet   Oral   Take 25 mg by mouth every 6 (six) hours as needed. allergy         .  doxycycline (VIBRAMYCIN) 100 MG capsule   Oral   Take 1 capsule (100 mg total) by mouth 2 (two) times daily.   10 capsule   0   . Flaxseed, Linseed, (FLAXSEED OIL PO)   Oral   Take 1 capsule by mouth 3 (three) times daily.          Marland Kitchen guaiFENesin (ROBITUSSIN) 100 MG/5ML liquid   Oral   Take 200 mg by mouth 3 (three) times daily as needed for cough.         . hydrOXYzine (VISTARIL) 50 MG capsule   Oral   Take 50 mg by mouth as needed.         Marland Kitchen ibuprofen (ADVIL,MOTRIN) 400 MG tablet   Oral   Take 1 tablet (400 mg total) by mouth every 6 (six) hours as needed for pain.   30 tablet   0   . ibuprofen (ADVIL,MOTRIN) 600 MG tablet   Oral   Take 1 tablet (600 mg total) by mouth every 6 (six) hours as needed for pain.   30 tablet   0   . loratadine (CLARITIN) 10 MG tablet   Oral   Take 1 tablet (10 mg total) by mouth daily.   7 tablet   0   .  Oxycodone-Acetaminophen (PERCOCET PO)   Oral   Take 1 tablet by mouth daily as needed. For pain.         Marland Kitchen oxyCODONE-acetaminophen (PERCOCET/ROXICET) 5-325 MG per tablet   Oral   Take 1-2 tablets by mouth every 6 (six) hours as needed for pain.   15 tablet   0   . ranitidine (ZANTAC) 150 MG tablet   Oral   Take 150 mg by mouth 2 (two) times daily.            BP 137/82  Pulse 85  Temp(Src) 98.3 F (36.8 C) (Oral)  Resp 16  SpO2 97%  Physical Exam General: Well-developed, well-nourished male in no acute distress; appearance consistent with age of record HENT: normocephalic; atraumatic Eyes: pupils equal, round and reactive to light; extraocular muscles intact Neck: supple Heart: regular rate and rhythm; no murmurs, rubs or gallops Lungs: clear to auscultation bilaterally Abdomen: soft; nondistended; lower abdominal tenderness most acute in the left lower quadrant; no masses or hepatosplenomegaly; bowel sounds present Extremities: No deformity; full range of motion Neurologic: Awake, alert and oriented; motor  function intact in all extremities and symmetric; no facial droop Skin: Warm and dry Psychiatric: Normal mood and affect    ED Course  Procedures (including critical care time)  MDM  Nursing notes and vitals signs, including pulse oximetry, reviewed.  Summary of this visit's results, reviewed by myself:  Labs:  No results found for this or any previous visit (from the past 24 hour(s)).  Imaging Studies: Ct Abdomen Pelvis Wo Contrast  02/28/2013   CLINICAL DATA:  Left lower quadrant abdominal pain and nausea.  EXAM: CT ABDOMEN AND PELVIS WITHOUT CONTRAST  TECHNIQUE: Multidetector CT imaging of the abdomen and pelvis was performed following the standard protocol without intravenous contrast.  COMPARISON:  CT of the abdomen and pelvis performed 01/23/2013  FINDINGS: The visualized lung bases are clear.  The liver and spleen are unremarkable in appearance. The gallbladder is within normal limits. The pancreas and adrenal glands are unremarkable.  The kidneys are unremarkable in appearance. There is no evidence of hydronephrosis. No renal or ureteral stones are seen. No perinephric stranding is appreciated.  No free fluid is identified. Note is made of a small Richter hernia at the umbilicus, involving a knob of small bowel. There is no evidence for obstruction or strangulation. The visualized small bowel is otherwise unremarkable. The stomach is within normal limits. No acute vascular abnormalities are seen.  The appendix is normal in caliber, without evidence for appendicitis. A small appendicolith is incidentally noted within the appendix; the appendix remains decompressed. The colon is unremarkable in appearance.  The bladder is mildly distended and grossly unremarkable in appearance. The prostate remains normal in size. No inguinal lymphadenopathy is seen.  No acute osseous abnormalities are identified.  IMPRESSION: 1. No acute abnormality seen to explain the patient's symptoms. 2. Small stable  Richter hernia noted at the umbilicus, involving the small bowel; no evidence for obstruction or strangulation.   Electronically Signed   By: Roanna Raider M.D.   On: 02/28/2013 04:10        Hanley Seamen, MD 02/28/13 [phone]

## 2013-02-28 NOTE — ED Notes (Signed)
Pt c/oof abd pain x a few weeks  Worse last 2 days,  w nausea, denies urinary problems  Diff w bm

## 2013-05-12 ENCOUNTER — Encounter (HOSPITAL_BASED_OUTPATIENT_CLINIC_OR_DEPARTMENT_OTHER): Payer: Self-pay | Admitting: Emergency Medicine

## 2013-05-12 ENCOUNTER — Emergency Department (HOSPITAL_BASED_OUTPATIENT_CLINIC_OR_DEPARTMENT_OTHER): Payer: Worker's Compensation

## 2013-05-12 ENCOUNTER — Emergency Department (HOSPITAL_BASED_OUTPATIENT_CLINIC_OR_DEPARTMENT_OTHER)
Admission: EM | Admit: 2013-05-12 | Discharge: 2013-05-13 | Disposition: A | Discharge status: Home / Self Care | Payer: Worker's Compensation | Attending: Emergency Medicine | Admitting: Emergency Medicine

## 2013-05-12 DIAGNOSIS — Y939 Activity, unspecified: Secondary | ICD-10-CM | POA: Insufficient documentation

## 2013-05-12 DIAGNOSIS — F411 Generalized anxiety disorder: Secondary | ICD-10-CM | POA: Insufficient documentation

## 2013-05-12 DIAGNOSIS — Y929 Unspecified place or not applicable: Secondary | ICD-10-CM | POA: Insufficient documentation

## 2013-05-12 DIAGNOSIS — W010XXA Fall on same level from slipping, tripping and stumbling without subsequent striking against object, initial encounter: Secondary | ICD-10-CM | POA: Insufficient documentation

## 2013-05-12 DIAGNOSIS — W1809XA Striking against other object with subsequent fall, initial encounter: Secondary | ICD-10-CM | POA: Insufficient documentation

## 2013-05-12 DIAGNOSIS — S99919A Unspecified injury of unspecified ankle, initial encounter: Secondary | ICD-10-CM

## 2013-05-12 DIAGNOSIS — Z792 Long term (current) use of antibiotics: Secondary | ICD-10-CM | POA: Insufficient documentation

## 2013-05-12 DIAGNOSIS — K219 Gastro-esophageal reflux disease without esophagitis: Secondary | ICD-10-CM | POA: Insufficient documentation

## 2013-05-12 DIAGNOSIS — S8990XA Unspecified injury of unspecified lower leg, initial encounter: Secondary | ICD-10-CM | POA: Insufficient documentation

## 2013-05-12 DIAGNOSIS — S99929A Unspecified injury of unspecified foot, initial encounter: Secondary | ICD-10-CM

## 2013-05-12 DIAGNOSIS — S8010XA Contusion of unspecified lower leg, initial encounter: Secondary | ICD-10-CM | POA: Insufficient documentation

## 2013-05-12 DIAGNOSIS — Z79899 Other long term (current) drug therapy: Secondary | ICD-10-CM | POA: Insufficient documentation

## 2013-05-12 DIAGNOSIS — W19XXXA Unspecified fall, initial encounter: Secondary | ICD-10-CM

## 2013-05-12 DIAGNOSIS — Z8614 Personal history of Methicillin resistant Staphylococcus aureus infection: Secondary | ICD-10-CM | POA: Insufficient documentation

## 2013-05-12 DIAGNOSIS — T148XXA Other injury of unspecified body region, initial encounter: Secondary | ICD-10-CM

## 2013-05-12 MED ORDER — IBUPROFEN 400 MG PO TABS
600.0000 mg | ORAL_TABLET | Freq: Once | ORAL | Status: DC
  Filled 2013-05-12 (×2): qty 1

## 2013-05-12 NOTE — ED Notes (Signed)
Slipped on ice and fell on his back. His right leg kicked up hitting underneath the car. Swelling to his right lower leg.

## 2013-05-13 MED ORDER — HYDROCODONE-ACETAMINOPHEN 5-325 MG PO TABS: 1.0000 | ORAL_TABLET | Freq: Four times a day (QID) | ORAL | Status: DC | PRN

## 2013-05-13 MED ORDER — IBUPROFEN 600 MG PO TABS: 600.0000 mg | ORAL_TABLET | Freq: Four times a day (QID) | ORAL | Status: DC | PRN

## 2013-05-13 NOTE — ED Provider Notes (Signed)
CSN: [id number]     Arrival date & time 05/12/13  2208 History   First MD Initiated Contact with Patient 05/12/13 2305     Chief Complaint  Patient presents with  . Fall     (Consider location/radiation/quality/duration/timing/severity/associated sxs/prior Treatment) HPI Comments: Pt slipped on ice and fell onto his back. Pain is worse in the leg - which hit the bottom of his car as his leg flung upwards. Injury at 6, ambulating - with pain and also has back pain. No associated new numbness, weakness, urinary incontinence, urinary retention, bowel incontinence, saddle anesthesia.    Patient is a 33 y.o. male presenting with fall. The history is provided by the patient.  Fall This is a new problem. The current episode started 3 to 5 hours ago. The problem has been gradually worsening. Pertinent negatives include no chest pain, no abdominal pain and no shortness of breath. Associated symptoms comments: Right leg pain and back pain. The symptoms are aggravated by walking and standing. He has tried nothing for the symptoms.    Past Medical History  Diagnosis Date  . MRSA (methicillin resistant staph aureus) culture positive   . GERD (gastroesophageal reflux disease)   . Anxiety    History reviewed. No pertinent past surgical history. No family history on file. History  Substance Use Topics  . Smoking status: Never Smoker   . Smokeless tobacco: Not on file  . Alcohol Use: Yes    Review of Systems  Constitutional: Negative for activity change and appetite change.  Respiratory: Negative for cough and shortness of breath.   Cardiovascular: Negative for chest pain.  Gastrointestinal: Negative for abdominal pain.  Genitourinary: Negative for dysuria.  Musculoskeletal: Positive for back pain.  Skin: Positive for wound. Negative for rash.      Allergies  Bactrim; Cephalexin; and Vicodin  Home Medications   Current Outpatient Rx  Name  Route  Sig  Dispense  Refill  .  acetaminophen (TYLENOL) 500 MG tablet   Oral   Take by mouth every 6 (six) hours as needed. pain         . AMOXICILLIN PO   Oral   Take by mouth 3 (three) times daily.         . clindamycin (CLEOCIN) 150 MG capsule   Oral   Take 1 capsule (150 mg total) by mouth every 6 (six) hours.   28 capsule   0   . clindamycin (CLEOCIN) 150 MG capsule   Oral   Take 1 capsule (150 mg total) by mouth every 6 (six) hours.   28 capsule   0   . diphenhydrAMINE (BENADRYL) 25 MG tablet   Oral   Take 25 mg by mouth every 6 (six) hours as needed. allergy         . doxycycline (VIBRAMYCIN) 100 MG capsule   Oral   Take 1 capsule (100 mg total) by mouth 2 (two) times daily.   10 capsule   0   . Flaxseed, Linseed, (FLAXSEED OIL PO)   Oral   Take 1 capsule by mouth 3 (three) times daily.          Marland Kitchen guaiFENesin (ROBITUSSIN) 100 MG/5ML liquid   Oral   Take 200 mg by mouth 3 (three) times daily as needed for cough.         Marland Kitchen HYDROcodone-acetaminophen (NORCO/VICODIN) 5-325 MG per tablet   Oral   Take 1 tablet by mouth every 6 (six) hours as needed.   6  tablet   0   . hydrOXYzine (VISTARIL) 50 MG capsule   Oral   Take 50 mg by mouth as needed.         Marland Kitchen ibuprofen (ADVIL,MOTRIN) 400 MG tablet   Oral   Take 1 tablet (400 mg total) by mouth every 6 (six) hours as needed for pain.   30 tablet   0   . ibuprofen (ADVIL,MOTRIN) 600 MG tablet   Oral   Take 1 tablet (600 mg total) by mouth every 6 (six) hours as needed for pain.   30 tablet   0   . ibuprofen (ADVIL,MOTRIN) 600 MG tablet   Oral   Take 1 tablet (600 mg total) by mouth every 6 (six) hours as needed.   30 tablet   0   . loratadine (CLARITIN) 10 MG tablet   Oral   Take 1 tablet (10 mg total) by mouth daily.   7 tablet   0   . Oxycodone-Acetaminophen (PERCOCET PO)   Oral   Take 1 tablet by mouth daily as needed. For pain.         Marland Kitchen oxyCODONE-acetaminophen (PERCOCET/ROXICET) 5-325 MG per tablet   Oral    Take 1-2 tablets by mouth every 6 (six) hours as needed for pain.   15 tablet   0   . polyethylene glycol (MIRALAX / GLYCOLAX) packet      Take 17 grams (once pack) with 8 ounces of water daily as needed for constipation.         . ranitidine (ZANTAC) 150 MG tablet   Oral   Take 150 mg by mouth 2 (two) times daily.            BP 131/82  Pulse 75  Temp(Src) 98.9 F (37.2 C) (Oral)  Resp 20  Ht 6\' 3"  (1.905 m)  Wt 262 lb (118.842 kg)  BMI 32.75 kg/m2  SpO2 97% Physical Exam  Nursing note and vitals reviewed. Constitutional: He is oriented to person, place, and time. He appears well-developed.  HENT:  Head: Normocephalic and atraumatic.  Eyes: Conjunctivae and EOM are normal. Pupils are equal, round, and reactive to light.  Neck: Normal range of motion. Neck supple.  Cardiovascular: Normal rate and regular rhythm.   Pulmonary/Chest: Effort normal and breath sounds normal.  Abdominal: Soft. Bowel sounds are normal. He exhibits no distension. There is no tenderness. There is no rebound and no guarding.  Musculoskeletal:  Right ant tibia - moderate ecchymoses and edema, with tenderness to palpation. Lower lumbar and sacral spine tenderness, no step offs  Neurological: He is alert and oriented to person, place, and time.  Ambulated- with limping secondary to leg pain.  Skin: Skin is warm.    ED Course  Procedures (including critical care time) Labs Review Labs Reviewed - No data to display Imaging Review Dg Tibia/fibula Right  05/12/2013   CLINICAL DATA:  Slipped on ice  EXAM: RIGHT TIBIA AND FIBULA - 2 VIEW  COMPARISON:  None.  FINDINGS: No fracture of the tibia or fibula.  No joint effusion.  IMPRESSION: No acute osseous abnormality.   Electronically Signed   By: Genevive Bi M.D.   On: 05/12/2013 23:00    EKG Interpretation   None       MDM   Final diagnoses:  Contusion  Fall    DDx includes: - Mechanical falls - ICH - Fractures - Contusions -  Soft tissue injury  Pt comes in post fall. Back pain -  no stepoffs, no red flags for cord compression on hx, able to get up and walk on his own power. Likely contusion, no indication for xrays. Leg Xrays are showing no fracture.  Partial weight bearing advocated, with RICE tx Sports f/u given, in case back and leg pain not improving.  Derwood KaplanAnkit Clarion Mooneyhan, MD 05/13/13 [phone]

## 2013-05-13 NOTE — Discharge Instructions (Signed)
Xrays show no fracture. Likely, you have contusion and hematoma - read information about your condition and RICE treatment below.  Back Pain, Adult Low back pain is very common. About 1 in 5 people have back pain.The cause of low back pain is rarely dangerous. The pain often gets better over time.About half of people with a sudden onset of back pain feel better in just 2 weeks. About 8 in 10 people feel better by 6 weeks.  CAUSES Some common causes of back pain include:  Strain of the muscles or ligaments supporting the spine.  Wear and tear (degeneration) of the spinal discs.  Arthritis.  Direct injury to the back. DIAGNOSIS Most of the time, the direct cause of low back pain is not known.However, back pain can be treated effectively even when the exact cause of the pain is unknown.Answering your caregiver's questions about your overall health and symptoms is one of the most accurate ways to make sure the cause of your pain is not dangerous. If your caregiver needs more information, he or she may order lab work or imaging tests (X-rays or MRIs).However, even if imaging tests show changes in your back, this usually does not require surgery. HOME CARE INSTRUCTIONS For many people, back pain returns.Since low back pain is rarely dangerous, it is often a condition that people can learn to [facility] their own.   Remain active. It is stressful on the back to sit or stand in one place. Do not sit, drive, or stand in one place for more than 30 minutes at a time. Take short walks on level surfaces as soon as pain allows.Try to increase the length of time you walk each day.  Do not stay in bed.Resting more than 1 or 2 days can delay your recovery.  Do not avoid exercise or work.Your body is made to move.It is not dangerous to be active, even though your back may hurt.Your back will likely heal faster if you return to being active before your pain is gone.  Pay attention to your body when  you bend and lift. Many people have less discomfortwhen lifting if they bend their knees, keep the load close to their bodies,and avoid twisting. Often, the most comfortable positions are those that put less stress on your recovering back.  Find a comfortable position to sleep. Use a firm mattress and lie on your side with your knees slightly bent. If you lie on your back, put a pillow under your knees.  Only take over-the-counter or prescription medicines as directed by your caregiver. Over-the-counter medicines to reduce pain and inflammation are often the most helpful.Your caregiver may prescribe muscle relaxant drugs.These medicines help dull your pain so you can more quickly return to your normal activities and healthy exercise.  Put ice on the injured area.  Put ice in a plastic bag.  Place a towel between your skin and the bag.  Leave the ice on for 15-20 minutes, 03-04 times a day for the first 2 to 3 days. After that, ice and heat may be alternated to reduce pain and spasms.  Ask your caregiver about trying back exercises and gentle massage. This may be of some benefit.  Avoid feeling anxious or stressed.Stress increases muscle tension and can worsen back pain.It is important to recognize when you are anxious or stressed and learn ways to manage it.Exercise is a great option. SEEK MEDICAL CARE IF:  You have pain that is not relieved with rest or medicine.  You have  pain that does not improve in 1 week.  You have new symptoms.  You are generally not feeling well. SEEK IMMEDIATE MEDICAL CARE IF:   You have pain that radiates from your back into your legs.  You develop new bowel or bladder control problems.  You have unusual weakness or numbness in your arms or legs.  You develop nausea or vomiting.  You develop abdominal pain.  You feel faint. Document Released: 03/12/2005 Document Revised: 09/11/2011 Document Reviewed: 07/31/2010 [facility] Patient Information  2014 [place]. Hematoma A hematoma is a collection of blood under the skin, in an organ, in a body space, in a joint space, or in other tissue. The blood can clot to form a lump that you can see and feel. The lump is often firm and may sometimes become sore and tender. Most hematomas get better in a few days to weeks. However, some hematomas may be serious and require medical care. Hematomas can range in size from very small to very large. CAUSES  A hematoma can be caused by a blunt or penetrating injury. It can also be caused by spontaneous leakage from a blood vessel under the skin. Spontaneous leakage from a blood vessel is more likely to occur in older people, especially those taking blood thinners. Sometimes, a hematoma can develop after certain medical procedures. SIGNS AND SYMPTOMS   A firm lump on the body.  Possible pain and tenderness in the area.  Bruising.Blue, dark blue, purple-red, or yellowish skin may appear at the site of the hematoma if the hematoma is close to the surface of the skin. For hematomas in deeper tissues or body spaces, the signs and symptoms may be subtle. For example, an intra-abdominal hematoma may cause abdominal pain, weakness, fainting, and shortness of breath. An intracranial hematoma may cause a headache or symptoms such as weakness, trouble speaking, or a change in consciousness. DIAGNOSIS  A hematoma can usually be diagnosed based on your medical history and a physical exam. Imaging tests may be needed if your health care provider suspects a hematoma in deeper tissues or body spaces, such as the abdomen, head, or chest. These tests may include ultrasonography or a CT scan.  TREATMENT  Hematomas usually go away on their own over time. Rarely does the blood need to be drained out of the body. Large hematomas or those that may affect vital organs will sometimes need surgical drainage or monitoring. HOME CARE INSTRUCTIONS   Apply ice to the injured area:    Put ice in a plastic bag.   Place a towel between your skin and the bag.   Leave the ice on for 20 minutes, 2 3 times a day for the first 1 to 2 days.   After the first 2 days, switch to using warm compresses on the hematoma.   Elevate the injured area to help decrease pain and swelling. Wrapping the area with an elastic bandage may also be helpful. Compression helps to reduce swelling and promotes shrinking of the hematoma. Make sure the bandage is not wrapped too tight.   If your hematoma is on a lower extremity and is painful, crutches may be helpful for a couple days.   Only take over-the-counter or prescription medicines as directed by your health care provider. SEEK IMMEDIATE MEDICAL CARE IF:   You have increasing pain, or your pain is not controlled with medicine.   You have a fever.   You have worsening swelling or discoloration.   Your skin over  the hematoma breaks or starts bleeding.   Your hematoma is in your chest or abdomen and you have weakness, shortness of breath, or a change in consciousness.  Your hematoma is on your scalp (caused by a fall or injury) and you have a worsening headache or a change in alertness or consciousness. MAKE SURE YOU:   Understand these instructions.  Will watch your condition.  Will get help right away if you are not doing well or get worse. Document Released: 10/25/2003 Document Revised: 11/12/2012 Document Reviewed: 08/20/2012 [facility] Patient Information 2014 [place], [place]. Contusion A contusion is a deep bruise. Contusions are the result of an injury that caused bleeding under the skin. The contusion may turn blue, purple, or yellow. Minor injuries will give you a painless contusion, but more severe contusions may stay painful and swollen for a few weeks.  CAUSES  A contusion is usually caused by a blow, trauma, or direct force to an area of the body. SYMPTOMS   Swelling and redness of the injured  area.  Bruising of the injured area.  Tenderness and soreness of the injured area.  Pain. DIAGNOSIS  The diagnosis can be made by taking a history and physical exam. An X-ray, CT scan, or MRI may be needed to determine if there were any associated injuries, such as fractures. TREATMENT  Specific treatment will depend on what area of the body was injured. In general, the best treatment for a contusion is resting, icing, elevating, and applying cold compresses to the injured area. Over-the-counter medicines may also be recommended for pain control. Ask your caregiver what the best treatment is for your contusion. HOME CARE INSTRUCTIONS   Put ice on the injured area.  Put ice in a plastic bag.  Place a towel between your skin and the bag.  Leave the ice on for 15-20 minutes, 03-04 times a day.  Only take over-the-counter or prescription medicines for pain, discomfort, or fever as directed by your caregiver. Your caregiver may recommend avoiding anti-inflammatory medicines (aspirin, ibuprofen, and naproxen) for 48 hours because these medicines may increase bruising.  Rest the injured area.  If possible, elevate the injured area to reduce swelling. SEEK IMMEDIATE MEDICAL CARE IF:   You have increased bruising or swelling.  You have pain that is getting worse.  Your swelling or pain is not relieved with medicines. MAKE SURE YOU:   Understand these instructions.  Will watch your condition.  Will get help right away if you are not doing well or get worse. Document Released: 12/20/2004 Document Revised: 06/04/2011 Document Reviewed: 01/15/2011 [facility] Patient Information 2014 [place], [place]. RICE: Routine Care for Injuries The routine care of many injuries includes Rest, Ice, Compression, and Elevation (RICE). HOME CARE INSTRUCTIONS  Rest is needed to allow your body to heal. Routine activities can usually be resumed when comfortable. Injured tendons and bones can take up to 6  weeks to heal. Tendons are the cord-like structures that attach muscle to bone.  Ice following an injury helps keep the swelling down and reduces pain.  Put ice in a plastic bag.  Place a towel between your skin and the bag.  Leave the ice on for 15-20 minutes, 03-04 times a day. Do this while awake, for the first 24 to 48 hours. After that, continue as directed by your caregiver.  Compression helps keep swelling down. It also gives support and helps with discomfort. If an elastic bandage has been applied, it should be removed and reapplied every 3  to 4 hours. It should not be applied tightly, but firmly enough to keep swelling down. Watch fingers or toes for swelling, bluish discoloration, coldness, numbness, or excessive pain. If any of these problems occur, remove the bandage and reapply loosely. Contact your caregiver if these problems continue.  Elevation helps reduce swelling and decreases pain. With extremities, such as the arms, hands, legs, and feet, the injured area should be placed near or above the level of the heart, if possible. SEEK IMMEDIATE MEDICAL CARE IF:  You have persistent pain and swelling.  You develop redness, numbness, or unexpected weakness.  Your symptoms are getting worse rather than improving after several days. These symptoms may indicate that further evaluation or further X-rays are needed. Sometimes, X-rays may not show a small broken bone (fracture) until 1 week or 10 days later. Make a follow-up appointment with your caregiver. Ask when your X-ray results will be ready. Make sure you get your X-ray results. Document Released: 06/24/2000 Document Revised: 06/04/2011 Document Reviewed: 08/11/2010 [facility] Patient Information 2014 [employer], [place].

## 2013-09-12 ENCOUNTER — Encounter (HOSPITAL_BASED_OUTPATIENT_CLINIC_OR_DEPARTMENT_OTHER): Payer: Self-pay | Admitting: Emergency Medicine

## 2013-09-12 ENCOUNTER — Emergency Department (HOSPITAL_BASED_OUTPATIENT_CLINIC_OR_DEPARTMENT_OTHER)
Admission: EM | Admit: 2013-09-12 | Discharge: 2013-09-12 | Disposition: A | Discharge status: Home / Self Care | Payer: Self-pay | Attending: Emergency Medicine | Admitting: Emergency Medicine

## 2013-09-12 DIAGNOSIS — Z8614 Personal history of Methicillin resistant Staphylococcus aureus infection: Secondary | ICD-10-CM | POA: Insufficient documentation

## 2013-09-12 DIAGNOSIS — L02419 Cutaneous abscess of limb, unspecified: Secondary | ICD-10-CM | POA: Insufficient documentation

## 2013-09-12 DIAGNOSIS — Z87891 Personal history of nicotine dependence: Secondary | ICD-10-CM | POA: Insufficient documentation

## 2013-09-12 DIAGNOSIS — F411 Generalized anxiety disorder: Secondary | ICD-10-CM | POA: Insufficient documentation

## 2013-09-12 DIAGNOSIS — L02416 Cutaneous abscess of left lower limb: Secondary | ICD-10-CM

## 2013-09-12 DIAGNOSIS — Z79899 Other long term (current) drug therapy: Secondary | ICD-10-CM | POA: Insufficient documentation

## 2013-09-12 DIAGNOSIS — L02415 Cutaneous abscess of right lower limb: Secondary | ICD-10-CM

## 2013-09-12 DIAGNOSIS — L03119 Cellulitis of unspecified part of limb: Principal | ICD-10-CM

## 2013-09-12 DIAGNOSIS — J45909 Unspecified asthma, uncomplicated: Secondary | ICD-10-CM | POA: Insufficient documentation

## 2013-09-12 DIAGNOSIS — K219 Gastro-esophageal reflux disease without esophagitis: Secondary | ICD-10-CM | POA: Insufficient documentation

## 2013-09-12 DIAGNOSIS — Z792 Long term (current) use of antibiotics: Secondary | ICD-10-CM | POA: Insufficient documentation

## 2013-09-12 HISTORY — DX: Unspecified asthma, uncomplicated: J45.909

## 2013-09-12 MED ORDER — DOXYCYCLINE HYCLATE 100 MG PO TABS
100.0000 mg | ORAL_TABLET | Freq: Once | ORAL | Status: AC
  Administered 2013-09-12: 100 mg via ORAL
  Filled 2013-09-12: qty 1

## 2013-09-12 MED ORDER — DOXYCYCLINE HYCLATE 100 MG PO CAPS: 100.0000 mg | ORAL_CAPSULE | Freq: Two times a day (BID) | ORAL | Status: DC

## 2013-09-12 NOTE — ED Provider Notes (Signed)
CSN: [id number]     Arrival date & time 09/12/13  0026 History   First MD Initiated Contact with Patient 09/12/13 0051     Chief Complaint  Patient presents with  . Abscess     (Consider location/radiation/quality/duration/timing/severity/associated sxs/prior Treatment) HPI This is a 33 year old male with history of multiple MRSA abscesses. He states he has had several abscesses over the past month which have drained spontaneously and resolved. He is here now with 2 abscesses that have developed over about the past 24 hours. They're located on his left and right anterior lateral thighs. There are small and mild to moderately tender. They're not draining. He did have some subjective fever and chills yesterday and he feels achy.  Past Medical History  Diagnosis Date  . MRSA (methicillin resistant staph aureus) culture positive   . GERD (gastroesophageal reflux disease)   . Anxiety   . Asthma    History reviewed. No pertinent past surgical history. No family history on file. History  Substance Use Topics  . Smoking status: Former Games developermoker  . Smokeless tobacco: Not on file  . Alcohol Use: No    Review of Systems  All other systems reviewed and are negative.  Allergies  Bactrim; Cephalexin; and Vicodin  Home Medications   Prior to Admission medications   Medication Sig Start Date End Date Taking? Authorizing Provider  buPROPion (WELLBUTRIN XL) 150 MG 24 hr tablet Take 150 mg by mouth daily.   Yes Historical Provider, MD  risperiDONE (RISPERDAL) 1 MG tablet Take 1 mg by mouth at bedtime.   Yes Historical Provider, MD  temazepam (RESTORIL) 15 MG capsule Take 15 mg by mouth at bedtime as needed for sleep.   Yes Historical Provider, MD  acetaminophen (TYLENOL) 500 MG tablet Take by mouth every 6 (six) hours as needed. pain    Historical Provider, MD  AMOXICILLIN PO Take by mouth 3 (three) times daily.    Historical Provider, MD  clindamycin (CLEOCIN) 150 MG capsule Take 1 capsule  (150 mg total) by mouth every 6 (six) hours. 07/15/12   April K Palumbo-Rasch, MD  clindamycin (CLEOCIN) 150 MG capsule Take 1 capsule (150 mg total) by mouth every 6 (six) hours. 07/15/12   April K Palumbo-Rasch, MD  diphenhydrAMINE (BENADRYL) 25 MG tablet Take 25 mg by mouth every 6 (six) hours as needed. allergy    Historical Provider, MD  doxycycline (VIBRAMYCIN) 100 MG capsule Take 1 capsule (100 mg total) by mouth 2 (two) times daily. 04/05/12   John L Molpus, MD  Flaxseed, Linseed, (FLAXSEED OIL PO) Take 1 capsule by mouth 3 (three) times daily.     Historical Provider, MD  guaiFENesin (ROBITUSSIN) 100 MG/5ML liquid Take 200 mg by mouth 3 (three) times daily as needed for cough.    Historical Provider, MD  HYDROcodone-acetaminophen (NORCO/VICODIN) 5-325 MG per tablet Take 1 tablet by mouth every 6 (six) hours as needed. 05/13/13   Derwood KaplanAnkit Nanavati, MD  hydrOXYzine (VISTARIL) 50 MG capsule Take 50 mg by mouth as needed.    Historical Provider, MD  ibuprofen (ADVIL,MOTRIN) 400 MG tablet Take 1 tablet (400 mg total) by mouth every 6 (six) hours as needed for pain. 05/07/12   April K Palumbo-Rasch, MD  ibuprofen (ADVIL,MOTRIN) 600 MG tablet Take 1 tablet (600 mg total) by mouth every 6 (six) hours as needed for pain. 07/15/12   April K Palumbo-Rasch, MD  ibuprofen (ADVIL,MOTRIN) 600 MG tablet Take 1 tablet (600 mg total) by mouth every 6 (six)  hours as needed. 05/13/13   Derwood KaplanAnkit Nanavati, MD  loratadine (CLARITIN) 10 MG tablet Take 1 tablet (10 mg total) by mouth daily. 05/07/12   April K Palumbo-Rasch, MD  Oxycodone-Acetaminophen (PERCOCET PO) Take 1 tablet by mouth daily as needed. For pain.    Historical Provider, MD  oxyCODONE-acetaminophen (PERCOCET/ROXICET) 5-325 MG per tablet Take 1-2 tablets by mouth every 6 (six) hours as needed for pain. 04/05/12   John L Molpus, MD  polyethylene glycol (MIRALAX / GLYCOLAX) packet Take 17 grams (once pack) with 8 ounces of water daily as needed for constipation.  02/28/13   John L Molpus, MD  ranitidine (ZANTAC) 150 MG tablet Take 150 mg by mouth 2 (two) times daily.      Historical Provider, MD   BP 135/98  Pulse 98  Temp(Src) 99.3 F (37.4 C) (Oral)  Resp 16  Ht 6\' 3"  (1.905 m)  Wt 251 lb (113.853 kg)  BMI 31.37 kg/m2  SpO2 97%  Physical Exam General: Well-developed, well-nourished male in no acute distress; appearance consistent with age of record HENT: normocephalic; atraumatic Eyes: pupils equal, round and reactive to light; extraocular muscles intact Neck: supple Heart: regular rate and rhythm Lungs: clear to auscultation bilaterally Abdomen: soft; nondistended; nontender; no masses or hepatosplenomegaly; bowel sounds present Extremities: No deformity; full range of motion Neurologic: Awake, alert and oriented; motor function intact in all extremities and symmetric; no facial droop Skin: Warm and dry; small, tender erythematous lesions bilateral antero-lateral thighs without fluctuance or pointing. Psychiatric: Normal mood and affect    ED Course  Procedures (including critical care time)   MDM  This or early abscesses for which I&D is not indicated at this time. We will treat with antibiotics and have him return should they worsen.    Hanley SeamenJohn L Molpus, MD 09/12/13 [phone]

## 2013-09-12 NOTE — ED Notes (Signed)
Abscesses in belt area x1 month.  Hx of abscesses that needed draining.

## 2013-09-12 NOTE — Discharge Instructions (Signed)

## 2013-11-10 ENCOUNTER — Emergency Department (HOSPITAL_BASED_OUTPATIENT_CLINIC_OR_DEPARTMENT_OTHER)
Admission: EM | Admit: 2013-11-10 | Discharge: 2013-11-10 | Disposition: A | Discharge status: Home / Self Care | Payer: Self-pay | Attending: Emergency Medicine | Admitting: Emergency Medicine

## 2013-11-10 ENCOUNTER — Encounter (HOSPITAL_BASED_OUTPATIENT_CLINIC_OR_DEPARTMENT_OTHER): Payer: Self-pay | Admitting: Emergency Medicine

## 2013-11-10 DIAGNOSIS — F411 Generalized anxiety disorder: Secondary | ICD-10-CM | POA: Insufficient documentation

## 2013-11-10 DIAGNOSIS — Z79899 Other long term (current) drug therapy: Secondary | ICD-10-CM | POA: Insufficient documentation

## 2013-11-10 DIAGNOSIS — K219 Gastro-esophageal reflux disease without esophagitis: Secondary | ICD-10-CM | POA: Insufficient documentation

## 2013-11-10 DIAGNOSIS — Z202 Contact with and (suspected) exposure to infections with a predominantly sexual mode of transmission: Secondary | ICD-10-CM | POA: Insufficient documentation

## 2013-11-10 DIAGNOSIS — Z711 Person with feared health complaint in whom no diagnosis is made: Secondary | ICD-10-CM

## 2013-11-10 DIAGNOSIS — Z8614 Personal history of Methicillin resistant Staphylococcus aureus infection: Secondary | ICD-10-CM | POA: Insufficient documentation

## 2013-11-10 DIAGNOSIS — Z87891 Personal history of nicotine dependence: Secondary | ICD-10-CM | POA: Insufficient documentation

## 2013-11-10 DIAGNOSIS — J45909 Unspecified asthma, uncomplicated: Secondary | ICD-10-CM | POA: Insufficient documentation

## 2013-11-10 LAB — URINALYSIS, ROUTINE W REFLEX MICROSCOPIC
Glucose, UA: NEGATIVE mg/dL
HGB URINE DIPSTICK: NEGATIVE
Ketones, ur: 15 mg/dL — AB
Leukocytes, UA: NEGATIVE
Nitrite: NEGATIVE
Protein, ur: 30 mg/dL — AB
Specific Gravity, Urine: 1.035 — ABNORMAL HIGH (ref 1.005–1.030)
Urobilinogen, UA: 1 mg/dL (ref 0.0–1.0)
pH: 6 (ref 5.0–8.0)

## 2013-11-10 LAB — URINE MICROSCOPIC-ADD ON

## 2013-11-10 MED ORDER — DOXYCYCLINE HYCLATE 100 MG PO TABS
100.0000 mg | ORAL_TABLET | Freq: Once | ORAL | Status: AC
  Administered 2013-11-10: 100 mg via ORAL
  Filled 2013-11-10: qty 1

## 2013-11-10 MED ORDER — AZITHROMYCIN 1 G PO PACK
1.0000 g | PACK | Freq: Once | ORAL | Status: AC
  Administered 2013-11-10: 1 g via ORAL
  Filled 2013-11-10: qty 1

## 2013-11-10 MED ORDER — DOXYCYCLINE HYCLATE 100 MG PO CAPS
100.0000 mg | ORAL_CAPSULE | Freq: Two times a day (BID) | ORAL | Status: DC
Start: 2013-11-10 — End: 2017-01-01

## 2013-11-10 NOTE — ED Provider Notes (Signed)
CSN: [id number]     Arrival date & time 11/10/13  0158 History   First MD Initiated Contact with Patient 11/10/13 0253     Chief Complaint  Patient presents with  . SEXUALLY TRANSMITTED DISEASE     (Consider location/radiation/quality/duration/timing/severity/associated sxs/prior Treatment) Patient is a 33 y.o. male presenting with STD exposure. The history is provided by the patient. No language interpreter was used.  Exposure to STD This is a new problem. The current episode started more than 2 days ago. The problem occurs constantly. The problem has not changed since onset.Pertinent negatives include no chest pain, no headaches and no shortness of breath. Nothing aggravates the symptoms. Nothing relieves the symptoms. He has tried nothing for the symptoms. The treatment provided no relief.  Told nurse wife had BV was unable to tell EDP what his wife was treated for and states he could not contact her  Past Medical History  Diagnosis Date  . MRSA (methicillin resistant staph aureus) culture positive   . GERD (gastroesophageal reflux disease)   . Anxiety   . Asthma    History reviewed. No pertinent past surgical history. History reviewed. No pertinent family history. History  Substance Use Topics  . Smoking status: Former Games developer  . Smokeless tobacco: Not on file  . Alcohol Use: No    Review of Systems  Constitutional: Negative for fever.  Respiratory: Negative for shortness of breath.   Cardiovascular: Negative for chest pain.  Genitourinary: Negative for dysuria, discharge, genital sores and penile pain.  Neurological: Negative for headaches.  All other systems reviewed and are negative.     Allergies  Bactrim; Cephalexin; and Vicodin  Home Medications   Prior to Admission medications   Medication Sig Start Date End Date Taking? Authorizing Provider  buPROPion (WELLBUTRIN XL) 150 MG 24 hr tablet Take 150 mg by mouth daily.    Historical Provider, MD   diphenhydrAMINE (BENADRYL) 25 MG tablet Take 25 mg by mouth every 6 (six) hours as needed. allergy    Historical Provider, MD  doxycycline (VIBRAMYCIN) 100 MG capsule Take 1 capsule (100 mg total) by mouth 2 (two) times daily. One po bid x 7 days 11/10/13   Jensen Kilburg K Jolan Mealor-Rasch, MD  hydrOXYzine (VISTARIL) 50 MG capsule Take 50 mg by mouth as needed.    Historical Provider, MD  ranitidine (ZANTAC) 150 MG tablet Take 150 mg by mouth 2 (two) times daily.      Historical Provider, MD  risperiDONE (RISPERDAL) 1 MG tablet Take 1 mg by mouth at bedtime.    Historical Provider, MD  temazepam (RESTORIL) 15 MG capsule Take 15 mg by mouth at bedtime as needed for sleep.    Historical Provider, MD   BP 122/82  Pulse 68  Temp(Src) 97.8 F (36.6 C) (Oral)  Resp 16  Ht 6\' 3"  (1.905 m)  Wt 260 lb (117.935 kg)  BMI 32.50 kg/m2  SpO2 99% Physical Exam  Constitutional: He is oriented to person, place, and time. He appears well-developed and well-nourished. No distress.  HENT:  Head: Normocephalic and atraumatic.  Mouth/Throat: Oropharynx is clear and moist.  Eyes: Conjunctivae are normal. Pupils are equal, round, and reactive to light.  Neck: Normal range of motion. Neck supple.  Cardiovascular: Normal rate, regular rhythm and intact distal pulses.   Pulmonary/Chest: Effort normal and breath sounds normal. He has no wheezes. He has no rales.  Abdominal: Soft. Bowel sounds are normal. There is no tenderness. There is no rebound and no guarding.  Musculoskeletal: Normal range of motion.  Neurological: He is alert and oriented to person, place, and time.  Skin: Skin is warm and dry.  Psychiatric: He has a normal mood and affect.    ED Course  Procedures (including critical care time) Labs Review Labs Reviewed  URINALYSIS, ROUTINE W REFLEX MICROSCOPIC - Abnormal; Notable for the following:    Specific Gravity, Urine 1.035 (*)    Bilirubin Urine SMALL (*)    Ketones, ur 15 (*)    Protein, ur 30 (*)     All other components within normal limits  URINE MICROSCOPIC-ADD ON    Imaging Review No results found.   EKG Interpretation None      MDM   Final diagnoses:  Concern about STD in male without diagnosis   Does not know what his wife was treated for or if it was an STI.  Wants just to be treated Will treat with doxycycline.  Has cephalosporin allergy    Khaliya Golinski K Jamaine Quintin-Rasch, MD 11/10/13 [phone]

## 2013-11-10 NOTE — ED Notes (Signed)
States wife is being treated for BV

## 2014-03-12 ENCOUNTER — Encounter (HOSPITAL_BASED_OUTPATIENT_CLINIC_OR_DEPARTMENT_OTHER): Payer: Self-pay | Admitting: Emergency Medicine

## 2014-03-12 ENCOUNTER — Emergency Department (HOSPITAL_BASED_OUTPATIENT_CLINIC_OR_DEPARTMENT_OTHER)
Admission: EM | Admit: 2014-03-12 | Discharge: 2014-03-12 | Disposition: A | Discharge status: Home / Self Care | Payer: Self-pay | Attending: Emergency Medicine | Admitting: Emergency Medicine

## 2014-03-12 DIAGNOSIS — R519 Headache, unspecified: Secondary | ICD-10-CM

## 2014-03-12 DIAGNOSIS — J45909 Unspecified asthma, uncomplicated: Secondary | ICD-10-CM | POA: Insufficient documentation

## 2014-03-12 DIAGNOSIS — Z79899 Other long term (current) drug therapy: Secondary | ICD-10-CM | POA: Insufficient documentation

## 2014-03-12 DIAGNOSIS — Z87891 Personal history of nicotine dependence: Secondary | ICD-10-CM | POA: Insufficient documentation

## 2014-03-12 DIAGNOSIS — F419 Anxiety disorder, unspecified: Secondary | ICD-10-CM | POA: Insufficient documentation

## 2014-03-12 DIAGNOSIS — Z8614 Personal history of Methicillin resistant Staphylococcus aureus infection: Secondary | ICD-10-CM | POA: Insufficient documentation

## 2014-03-12 DIAGNOSIS — H6123 Impacted cerumen, bilateral: Secondary | ICD-10-CM | POA: Insufficient documentation

## 2014-03-12 DIAGNOSIS — R51 Headache: Secondary | ICD-10-CM | POA: Insufficient documentation

## 2014-03-12 DIAGNOSIS — Z792 Long term (current) use of antibiotics: Secondary | ICD-10-CM | POA: Insufficient documentation

## 2014-03-12 DIAGNOSIS — K219 Gastro-esophageal reflux disease without esophagitis: Secondary | ICD-10-CM | POA: Insufficient documentation

## 2014-03-12 LAB — RAPID STREP SCREEN (MED CTR MEBANE ONLY): Streptococcus, Group A Screen (Direct): NEGATIVE

## 2014-03-12 MED ORDER — KETOROLAC TROMETHAMINE 60 MG/2ML IM SOLN
60.0000 mg | Freq: Once | INTRAMUSCULAR | Status: AC
  Administered 2014-03-12: 60 mg via INTRAMUSCULAR
  Filled 2014-03-12: qty 2

## 2014-03-12 MED ORDER — METOCLOPRAMIDE HCL 10 MG PO TABS: 10.0000 mg | ORAL_TABLET | Freq: Four times a day (QID) | ORAL | Status: DC

## 2014-03-12 MED ORDER — METHOCARBAMOL 500 MG PO TABS
1000.0000 mg | ORAL_TABLET | Freq: Once | ORAL | Status: AC
  Administered 2014-03-12: 1000 mg via ORAL
  Filled 2014-03-12: qty 2

## 2014-03-12 NOTE — ED Provider Notes (Signed)
CSN: [id number]     Arrival date & time 03/12/14  0308 History   First MD Initiated Contact with Patient 03/12/14 0359     Chief Complaint  Patient presents with  . Migraine     (Consider location/radiation/quality/duration/timing/severity/associated sxs/prior Treatment) Patient is a 33 y.o. male presenting with headaches. The history is provided by the patient.  Headache Pain location:  Generalized Quality:  Dull Radiates to:  Does not radiate Onset quality:  Gradual Timing:  Constant Progression:  Waxing and waning Chronicity:  Recurrent Context: not activity, not defecating and not eating   Relieved by:  NSAIDs Worsened by:  Nothing tried Ineffective treatments: excedrin. Associated symptoms: congestion   Associated symptoms: no fever, no neck stiffness, no paresthesias, no swollen glands and no weakness   Risk factors: no anger   [facility] and had labwork and head CT all normal.  Discharged with rx for muscle relaxant and pain has returned.   2 hours of dry cough.    Past Medical History  Diagnosis Date  . MRSA (methicillin resistant staph aureus) culture positive   . GERD (gastroesophageal reflux disease)   . Anxiety   . Asthma    History reviewed. No pertinent past surgical history. History reviewed. No pertinent family history. History  Substance Use Topics  . Smoking status: Former Games developermoker  . Smokeless tobacco: Not on file  . Alcohol Use: No    Review of Systems  Constitutional: Negative for fever.  HENT: Positive for congestion.   Musculoskeletal: Negative for neck stiffness.  Skin: Negative for rash.  Neurological: Positive for headaches. Negative for paresthesias.  All other systems reviewed and are negative.     Allergies  Bactrim; Cephalexin; and Vicodin  Home Medications   Prior to Admission medications   Medication Sig Start Date End Date Taking? Authorizing Provider  hydrOXYzine (VISTARIL) 50 MG capsule Take 50 mg by mouth  as needed.   Yes Historical Provider, MD  buPROPion (WELLBUTRIN XL) 150 MG 24 hr tablet Take 150 mg by mouth daily.    Historical Provider, MD  diphenhydrAMINE (BENADRYL) 25 MG tablet Take 25 mg by mouth every 6 (six) hours as needed. allergy    Historical Provider, MD  doxycycline (VIBRAMYCIN) 100 MG capsule Take 1 capsule (100 mg total) by mouth 2 (two) times daily. One po bid x 7 days 11/10/13   Estefano Victory K Arlin Sass-Rasch, MD  ranitidine (ZANTAC) 150 MG tablet Take 150 mg by mouth 2 (two) times daily.      Historical Provider, MD  risperiDONE (RISPERDAL) 1 MG tablet Take 1 mg by mouth at bedtime.    Historical Provider, MD  temazepam (RESTORIL) 15 MG capsule Take 15 mg by mouth at bedtime as needed for sleep.    Historical Provider, MD   BP 128/78 mmHg  Pulse 89  Temp(Src) 98.4 F (36.9 C) (Oral)  Resp 18  Ht 6\' 3"  (1.905 m)  Wt 260 lb (117.935 kg)  BMI 32.50 kg/m2  SpO2 100% Physical Exam  Constitutional: He is oriented to person, place, and time. He appears well-developed and well-nourished. No distress.  Sitting in the room comfortably with lights on  HENT:  Head: Normocephalic and atraumatic.  B TM impacted with cerumen  Eyes: Conjunctivae and EOM are normal. Pupils are equal, round, and reactive to light.  Neck: Normal range of motion. Neck supple.  Cardiovascular: Normal rate, regular rhythm and intact distal pulses.   Pulmonary/Chest: Effort normal and breath sounds normal. He  has no wheezes. He has no rales.  Abdominal: Soft. Bowel sounds are normal. There is no tenderness. There is no rebound and no guarding.  Musculoskeletal: Normal range of motion.  Lymphadenopathy:    He has no cervical adenopathy.  Neurological: He is alert and oriented to person, place, and time. He has normal reflexes.  Skin: Skin is warm and dry. He is not diaphoretic.  Psychiatric: He has a normal mood and affect.    ED Course  Procedures (including critical care time) Labs Review Labs Reviewed   RAPID STREP SCREEN    Imaging Review No results found.   EKG Interpretation None      MDM    Visual Acuity  Right Eye Distance: 20/30 (with glasses) Left Eye Distance: 20/25 (with glasses) Bilateral Distance: 20/25 (with glasses)  Right Eye Near:   Left Eye Near:    Bilateral Near:    Final diagnoses:  None   [facility]  Had normal labs WBC 5.4 CT normal cxr normal.  Stated to EDP there HA was 2 weeks duration and similar to previous migraines had been seen 1 week ago.  No fevers.  No signs of infection on CT.  Records scanned in to the chart   Intact extraocular movements intact cognition no papilledema, cranial nerves are intact, highly doubt cavernous sinus thrombosis.    Headache for 1.5 weeks with [facility]  No fever no neck stiffness.  No indication for LP.  If patient had meningitis he would be in extremis at this point he is non toxic appearing.  There is no indication for labs at this time as this has happened in the past will refer to headache specialist.      Ryaan Vanwagoner K Leyla Soliz-Rasch, MD 03/12/14 [phone]

## 2014-03-12 NOTE — ED Notes (Signed)
Pt states he was seen at [facility] yesterday am for migraine, cough, told they thought he had a migraine, pt was at work tonight and he got dizzy, blurred vision and worsening headache

## 2014-03-12 NOTE — ED Notes (Addendum)
C/o ha and facial pain x 1.5 weeks w chills  Cough x 2-3 hours  Was seen at [facility] yesterday for same

## 2014-03-14 LAB — CULTURE, GROUP A STREP

## 2014-12-22 ENCOUNTER — Emergency Department (HOSPITAL_BASED_OUTPATIENT_CLINIC_OR_DEPARTMENT_OTHER): Payer: 59

## 2014-12-22 ENCOUNTER — Emergency Department (HOSPITAL_BASED_OUTPATIENT_CLINIC_OR_DEPARTMENT_OTHER)
Admission: EM | Admit: 2014-12-22 | Discharge: 2014-12-22 | Disposition: A | Discharge status: Home / Self Care | Payer: 59 | Attending: Emergency Medicine | Admitting: Emergency Medicine

## 2014-12-22 ENCOUNTER — Encounter (HOSPITAL_BASED_OUTPATIENT_CLINIC_OR_DEPARTMENT_OTHER): Payer: Self-pay

## 2014-12-22 DIAGNOSIS — K219 Gastro-esophageal reflux disease without esophagitis: Secondary | ICD-10-CM | POA: Diagnosis not present

## 2014-12-22 DIAGNOSIS — Z79899 Other long term (current) drug therapy: Secondary | ICD-10-CM | POA: Diagnosis not present

## 2014-12-22 DIAGNOSIS — J45909 Unspecified asthma, uncomplicated: Secondary | ICD-10-CM | POA: Insufficient documentation

## 2014-12-22 DIAGNOSIS — W228XXA Striking against or struck by other objects, initial encounter: Secondary | ICD-10-CM | POA: Insufficient documentation

## 2014-12-22 DIAGNOSIS — Z23 Encounter for immunization: Secondary | ICD-10-CM | POA: Insufficient documentation

## 2014-12-22 DIAGNOSIS — Z87891 Personal history of nicotine dependence: Secondary | ICD-10-CM | POA: Insufficient documentation

## 2014-12-22 DIAGNOSIS — Y9289 Other specified places as the place of occurrence of the external cause: Secondary | ICD-10-CM | POA: Diagnosis not present

## 2014-12-22 DIAGNOSIS — M79641 Pain in right hand: Secondary | ICD-10-CM

## 2014-12-22 DIAGNOSIS — Y9389 Activity, other specified: Secondary | ICD-10-CM | POA: Diagnosis not present

## 2014-12-22 DIAGNOSIS — Y998 Other external cause status: Secondary | ICD-10-CM | POA: Diagnosis not present

## 2014-12-22 DIAGNOSIS — S6991XA Unspecified injury of right wrist, hand and finger(s), initial encounter: Secondary | ICD-10-CM | POA: Insufficient documentation

## 2014-12-22 DIAGNOSIS — F419 Anxiety disorder, unspecified: Secondary | ICD-10-CM | POA: Insufficient documentation

## 2014-12-22 MED ORDER — TETANUS-DIPHTH-ACELL PERTUSSIS 5-2.5-18.5 LF-MCG/0.5 IM SUSP
0.5000 mL | Freq: Once | INTRAMUSCULAR | Status: AC
  Administered 2014-12-22: 0.5 mL via INTRAMUSCULAR
  Filled 2014-12-22: qty 0.5

## 2014-12-22 MED ORDER — IBUPROFEN 800 MG PO TABS
800.0000 mg | ORAL_TABLET | Freq: Once | ORAL | Status: AC
  Administered 2014-12-22: 800 mg via ORAL
  Filled 2014-12-22: qty 1

## 2014-12-22 MED ORDER — IBUPROFEN 800 MG PO TABS: 800.0000 mg | ORAL_TABLET | Freq: Three times a day (TID) | ORAL | Status: DC

## 2014-12-22 NOTE — ED Notes (Signed)
Pt punched a brick wall injuring right hand.  Swelling and pain to hand, denies pain up into wrist or forearm.  No abrasions from punch appreciated on exam.

## 2014-12-22 NOTE — Discharge Instructions (Signed)
1. Medications: ibuprofen, usual home medications 2. Treatment: rest, drink plenty of fluids, ice, elevate, wear wrist splint 3. Follow Up: please followup with your primary doctor within the next week for discussion of your diagnoses and further evaluation after today's visit; if you do not have a primary care doctor use the resource guide provided to find one; please return to the ER for severe pain, redness, heat, numbness, tingling, weakness, new or worsening symptoms   Musculoskeletal Pain Musculoskeletal pain is muscle and boney aches and pains. These pains can occur in any part of the body. Your caregiver may treat you without knowing the cause of the pain. They may treat you if blood or urine tests, X-rays, and other tests were normal.  CAUSES There is often not a definite cause or reason for these pains. These pains may be caused by a type of germ (virus). The discomfort may also come from overuse. Overuse includes working out too hard when your body is not fit. Boney aches also come from weather changes. Bone is sensitive to atmospheric pressure changes. HOME CARE INSTRUCTIONS   Ask when your test results will be ready. Make sure you get your test results.  Only take over-the-counter or prescription medicines for pain, discomfort, or fever as directed by your caregiver. If you were given medications for your condition, do not drive, operate machinery or power tools, or sign legal documents for 24 hours. Do not drink alcohol. Do not take sleeping pills or other medications that may interfere with treatment.  Continue all activities unless the activities cause more pain. When the pain lessens, slowly resume normal activities. Gradually increase the intensity and duration of the activities or exercise.  During periods of severe pain, bed rest may be helpful. Lay or sit in any position that is comfortable.  Putting ice on the injured area.  Put ice in a bag.  Place a towel between your  skin and the bag.  Leave the ice on for 15 to 20 minutes, 3 to 4 times a day.  Follow up with your caregiver for continued problems and no reason can be found for the pain. If the pain becomes worse or does not go away, it may be necessary to repeat tests or do additional testing. Your caregiver may need to look further for a possible cause. SEEK IMMEDIATE MEDICAL CARE IF:  You have pain that is getting worse and is not relieved by medications.  You develop chest pain that is associated with shortness or breath, sweating, feeling sick to your stomach (nauseous), or throw up (vomit).  Your pain becomes localized to the abdomen.  You develop any new symptoms that seem different or that concern you. MAKE SURE YOU:   Understand these instructions.  Will watch your condition.  Will get help right away if you are not doing well or get worse. Document Released: 03/12/2005 Document Revised: 06/04/2011 Document Reviewed: 11/14/2012 [facility] Patient Information 2015 [place], [place]. This information is not intended to replace advice given to you by your health care provider. Make sure you discuss any questions you have with your health care provider.

## 2014-12-22 NOTE — ED Provider Notes (Signed)
CSN: [id number]     Arrival date & time 12/22/14  1938 History   First MD Initiated Contact with Patient 12/22/14 2030     Chief Complaint  Patient presents with  . Hand Injury    HPI   Bryan Gill is a 34 y.o. male with a PMH of anxiety, GERD who presents to the ED with right wrist and hand pain after punching a brick wall around 4 PM this afternoon. He reports constant pain and worsening swelling since that time. He states movement exacerbates his pain. He has not tried anything for symptom relief. He reports abrasion to the dorsal aspect of his right fifth MCP. He states his tetanus is not up-to-date. He denies numbness, paresthesia, weakness.    Past Medical History  Diagnosis Date  . MRSA (methicillin resistant staph aureus) culture positive   . GERD (gastroesophageal reflux disease)   . Anxiety   . Asthma    History reviewed. No pertinent past surgical history. No family history on file. Social History  Substance Use Topics  . Smoking status: Former Games developer  . Smokeless tobacco: None  . Alcohol Use: No      Review of Systems  Musculoskeletal: Positive for joint swelling and arthralgias.  Skin: Positive for wound.  Neurological: Negative for weakness and numbness.  All other systems reviewed and are negative.     Allergies  Bactrim; Cephalexin; and Vicodin  Home Medications   Prior to Admission medications   Medication Sig Start Date End Date Taking? Authorizing Provider  buPROPion (WELLBUTRIN XL) 150 MG 24 hr tablet Take 150 mg by mouth daily.    Historical Provider, MD  diphenhydrAMINE (BENADRYL) 25 MG tablet Take 25 mg by mouth every 6 (six) hours as needed. allergy    Historical Provider, MD  doxycycline (VIBRAMYCIN) 100 MG capsule Take 1 capsule (100 mg total) by mouth 2 (two) times daily. One po bid x 7 days 11/10/13   [name], MD  hydrOXYzine (VISTARIL) 50 MG capsule Take 50 mg by mouth as needed.    Historical Provider, MD  ibuprofen (ADVIL,MOTRIN)  800 MG tablet Take 1 tablet (800 mg total) by mouth 3 (three) times daily. 12/22/14   [name], [name]  metoCLOPramide (REGLAN) 10 MG tablet Take 1 tablet (10 mg total) by mouth every 6 (six) hours. 03/12/14   [name], MD  ranitidine (ZANTAC) 150 MG tablet Take 150 mg by mouth 2 (two) times daily.      Historical Provider, MD  risperiDONE (RISPERDAL) 1 MG tablet Take 1 mg by mouth at bedtime.    Historical Provider, MD  temazepam (RESTORIL) 15 MG capsule Take 15 mg by mouth at bedtime as needed for sleep.    Historical Provider, MD    BP 113/69 mmHg  Pulse 88  Temp(Src) 98.3 F (36.8 C) (Oral)  Resp 18  Ht  (1.905 m)  Wt 250 lb (113.399 kg)  BMI 31.25 kg/m2  SpO2 98% Physical Exam  Constitutional: He is oriented to person, place, and time. He appears well-developed and well-nourished. No distress.  HENT:  Head: Normocephalic and atraumatic.  Right Ear: External ear normal.  Left Ear: External ear normal.  Nose: Nose normal.  Mouth/Throat: Oropharynx is clear and moist.  Eyes: Conjunctivae and EOM are normal. Pupils are equal, round, and reactive to light. Right eye exhibits no discharge. Left eye exhibits no discharge. No scleral icterus.  Neck: Normal range of motion. Neck supple.  Cardiovascular: Normal rate, regular rhythm  and intact distal pulses.   Pulmonary/Chest: Effort normal and breath sounds normal. No respiratory distress.  Musculoskeletal: He exhibits edema and tenderness.  Diffuse tenderness to palpation of right MCPs with decreased range of motion due to pain and mild edema. Decreased strength due to pain. Sensation intact. Distal pulses intact. Cap refill < 3 seconds.  Neurological: He is alert and oriented to person, place, and time. No sensory deficit.  Skin: Skin is warm and dry. No rash noted. He is not diaphoretic. No erythema. No pallor.  0.5 cm abrasion to dorsal aspect of right fifth MCP.  Psychiatric: He has a normal mood and affect. His  behavior is normal. Judgment and thought content normal.  Nursing note and vitals reviewed.   ED Course  Procedures (including critical care time)  Labs Review Labs Reviewed - No data to display  Imaging Review Dg Wrist Complete Right  12/22/2014   CLINICAL DATA:  Punched wall today with right hand pain. Initial encounter.  EXAM: RIGHT WRIST - COMPLETE 3+ VIEW  COMPARISON:  02/14/2009  FINDINGS: Dorsal hand soft tissue swelling without wrist fracture or malalignment. No arthritic changes.  IMPRESSION: Soft tissue swelling without fracture.   Electronically Signed   By: Marnee Spring M.D.   On: 12/22/2014 21:30   Dg Hand Complete Right  12/22/2014   CLINICAL DATA:  Right hand pain after punching a wall. Initial encounter.  EXAM: RIGHT HAND - COMPLETE 3+ VIEW  COMPARISON:  None.  FINDINGS: Dorsal hand soft tissue swelling without acute fracture. Chronic posttraumatic deformity of the fourth finger tuft. No arthritic changes.  IMPRESSION: Soft tissue swelling without acute osseous finding.   Electronically Signed   By: Marnee Spring M.D.   On: 12/22/2014 21:29   I have personally reviewed and evaluated these images as part of my medical decision-making.   EKG Interpretation None      MDM   Final diagnoses:  Right hand pain    34 year old male presents with right hand pain and swelling after punching a brick wall around 4PM this afternoon. Denies numbness, weakness, paresthesia. Reports decreased range of motion due to pain.  Vital signs stable. TTP of right MCPs with mild edema and decreased range of motion due to pain. Abrasion to dorsal aspect of right 5th MCP. No repairable laceration. No signs of infection. Decreased strength due to pain. Sensation intact. Distal pulses intact. Cap refill <3 seconds.  Will obtain imaging of right hand and wrist. Patient given ibuprofen for pain control. Imaging demonstrates soft tissue swelling with no acute fracture or malalignment.  Will  treat with ibuprofen and RICE therapy. Patient to follow-up with PCP. Return precautions discussed.  BP 113/69 mmHg  Pulse 88  Temp(Src) 98.3 F (36.8 C) (Oral)  Resp 18  Ht  (1.905 m)  Wt 250 lb (113.399 kg)  BMI 31.25 kg/m2  SpO2 98%      [name], [name] 12/23/14 1031  [name], MD 01/05/15 [phone]

## 2015-06-09 DIAGNOSIS — R319 Hematuria, unspecified: Secondary | ICD-10-CM | POA: Insufficient documentation

## 2015-06-09 DIAGNOSIS — Z9049 Acquired absence of other specified parts of digestive tract: Secondary | ICD-10-CM | POA: Insufficient documentation

## 2015-10-13 ENCOUNTER — Emergency Department (HOSPITAL_BASED_OUTPATIENT_CLINIC_OR_DEPARTMENT_OTHER)
Admission: EM | Admit: 2015-10-13 | Discharge: 2015-10-13 | Disposition: A | Discharge status: Home / Self Care | Payer: Self-pay | Attending: Emergency Medicine | Admitting: Emergency Medicine

## 2015-10-13 ENCOUNTER — Encounter (HOSPITAL_BASED_OUTPATIENT_CLINIC_OR_DEPARTMENT_OTHER): Payer: Self-pay | Admitting: *Deleted

## 2015-10-13 DIAGNOSIS — J029 Acute pharyngitis, unspecified: Secondary | ICD-10-CM | POA: Insufficient documentation

## 2015-10-13 DIAGNOSIS — Z87891 Personal history of nicotine dependence: Secondary | ICD-10-CM | POA: Insufficient documentation

## 2015-10-13 DIAGNOSIS — J45909 Unspecified asthma, uncomplicated: Secondary | ICD-10-CM | POA: Insufficient documentation

## 2015-10-13 LAB — RAPID STREP SCREEN (MED CTR MEBANE ONLY): Streptococcus, Group A Screen (Direct): NEGATIVE

## 2015-10-13 NOTE — ED Provider Notes (Signed)
TIME SEEN: 3:40 AM  CHIEF COMPLAINT: Sore throat  HPI: Pt is a 35 y.o. male with history of asthma, anxiety, GERD who presents emergency department with sore throat for 3 days. No fevers, chills, cough, vomiting, diarrhea, rash. States his wife works in a daycare and his daughter recently got over pharyngitis which she states her pediatrician thought was a "touch of strep throat". Has not taken anything for pain at home. Has been doing saltwater gargles.  ROS: See HPI Constitutional: no fever  Eyes: no drainage  ENT: no runny nose   Cardiovascular:  no chest pain  Resp: no SOB  GI: no vomiting GU: no dysuria Integumentary: no rash  Allergy: no hives  Musculoskeletal: no leg swelling  Neurological: no slurred speech ROS otherwise negative  PAST MEDICAL HISTORY/PAST SURGICAL HISTORY:  Past Medical History  Diagnosis Date  . MRSA (methicillin resistant staph aureus) culture positive   . GERD (gastroesophageal reflux disease)   . Anxiety   . Asthma     MEDICATIONS:  Prior to Admission medications   Medication Sig Start Date End Date Taking? Authorizing Provider  buPROPion (WELLBUTRIN XL) 150 MG 24 hr tablet Take 150 mg by mouth daily.    Historical Provider, MD  diphenhydrAMINE (BENADRYL) 25 MG tablet Take 25 mg by mouth every 6 (six) hours as needed. allergy    Historical Provider, MD  doxycycline (VIBRAMYCIN) 100 MG capsule Take 1 capsule (100 mg total) by mouth 2 (two) times daily. One po bid x 7 days 11/10/13   [name], MD  hydrOXYzine (VISTARIL) 50 MG capsule Take 50 mg by mouth as needed.    Historical Provider, MD  ibuprofen (ADVIL,MOTRIN) 800 MG tablet Take 1 tablet (800 mg total) by mouth 3 (three) times daily. 12/22/14   [name], [name]  metoCLOPramide (REGLAN) 10 MG tablet Take 1 tablet (10 mg total) by mouth every 6 (six) hours. 03/12/14   [name], MD  ranitidine (ZANTAC) 150 MG tablet Take 150 mg by mouth 2 (two) times daily.      Historical  Provider, MD  risperiDONE (RISPERDAL) 1 MG tablet Take 1 mg by mouth at bedtime.    Historical Provider, MD  temazepam (RESTORIL) 15 MG capsule Take 15 mg by mouth at bedtime as needed for sleep.    Historical Provider, MD    ALLERGIES:  Allergies  Allergen Reactions  . Bactrim Rash  . Cephalexin Rash  . Vicodin [Hydrocodone-Acetaminophen] Palpitations    SOCIAL HISTORY:  Social History  Substance Use Topics  . Smoking status: Former Games developer  . Smokeless tobacco: Not on file  . Alcohol Use: No    FAMILY HISTORY: No family history on file.  EXAM: BP 110/84 mmHg  Pulse 61  Temp(Src) 97.8 F (36.6 C) (Oral)  Resp 18  Ht  (1.905 m)  Wt 220 lb (99.791 kg)  BMI 27.50 kg/m2  SpO2 100% CONSTITUTIONAL: Alert and oriented and responds appropriately to questions. Well-appearing; well-nourished HEAD: Normocephalic EYES: Conjunctivae clear, PERRL ENT: normal nose; no rhinorrhea; moist mucous membranes; No pharyngeal erythema or petechiae, no tonsillar hypertrophy or exudate, no uvular deviation, no trismus or drooling, normal phonation, no stridor, no dental caries or abscess noted, no Ludwig's angina, tongue sits flat in the bottom of the mouth; TMs are clear bilaterally without erythema, purulence, bulging, perforation, effusion.  No cerumen impaction or sign of foreign body in the external auditory canal. No inflammation, erythema or drainage from the external auditory canal. No signs of  mastoiditis. No pain with manipulation of the pinna bilaterally. NECK: Supple, no meningismus, no LAD  CARD: RRR; S1 and S2 appreciated; no murmurs, no clicks, no rubs, no gallops RESP: Normal chest excursion without splinting or tachypnea; breath sounds clear and equal bilaterally; no wheezes, no rhonchi, no rales, no hypoxia or respiratory distress, speaking full sentences ABD/GI: Normal bowel sounds; non-distended; soft, non-tender, no rebound, no guarding, no peritoneal signs BACK:  The back  appears normal and is non-tender to palpation, there is no CVA tenderness EXT: Normal ROM in all joints; non-tender to palpation; no edema; normal capillary refill; no cyanosis, no calf tenderness or swelling    SKIN: Normal color for age and race; warm; no rash NEURO: Moves all extremities equally, sensation to light touch intact diffusely, cranial nerves II through XII intact PSYCH: The patient's mood and manner are appropriate. Grooming and personal hygiene are appropriate.  MEDICAL DECISION MAKING: Patient here with pharyngitis. Strep test is negative. Doubt mononucleosis. Suspect viral pharyngitis. Nothing to suggest deep space neck infection, peritonsillar abscess. He is well-appearing, nontoxic. Have recommended alternating Tylenol, Motrin for fever and pain and using warm saltwater gargles, over-the-counter Chloraseptic Spray. I do not feel he needs to be on antibiotics at this time.    At this time, I do not feel there is any life-threatening condition present. I have reviewed and discussed all results (EKG, imaging, lab, urine as appropriate), exam findings with patient. I have reviewed nursing notes and appropriate previous records.  I feel the patient is safe to be discharged home without further emergent workup. Discussed usual and customary return precautions. Patient and family (if present) verbalize understanding and are comfortable with this plan.  Patient will follow-up with their primary care provider. If they do not have a primary care provider, information for follow-up has been provided to them. All questions have been answered.      Layla MawKristen N Kierstyn Baranowski, DO 10/13/15 [phone]

## 2015-10-13 NOTE — Discharge Instructions (Signed)
Your strep test today was negative. This is likely caused by a viral illness. You may alternate between Tylenol 1000 mg every 6 hours as needed for fever and pain and ibuprofen 800 mg every 8 hours as needed for fever and pain. Please continue your warm saltwater gargles. You may use over-the-counter Chloraseptic spray as well for pain. I do not feel antibiotics would be helpful as I feel this is a viral illness.   Pharyngitis Pharyngitis is redness, pain, and swelling (inflammation) of your pharynx.  CAUSES  Pharyngitis is usually caused by infection. Most of the time, these infections are from viruses (viral) and are part of a cold. However, sometimes pharyngitis is caused by bacteria (bacterial). Pharyngitis can also be caused by allergies. Viral pharyngitis may be spread from person to person by coughing, sneezing, and personal items or utensils (cups, forks, spoons, toothbrushes). Bacterial pharyngitis may be spread from person to person by more intimate contact, such as kissing.  SIGNS AND SYMPTOMS  Symptoms of pharyngitis include:   Sore throat.   Tiredness (fatigue).   Low-grade fever.   Headache.  Joint pain and muscle aches.  Skin rashes.  Swollen lymph nodes.  Plaque-like film on throat or tonsils (often seen with bacterial pharyngitis). DIAGNOSIS  Your health care provider will ask you questions about your illness and your symptoms. Your medical history, along with a physical exam, is often all that is needed to diagnose pharyngitis. Sometimes, a rapid strep test is done. Other lab tests may also be done, depending on the suspected cause.  TREATMENT  Viral pharyngitis will usually get better in 3-4 days without the use of medicine. Bacterial pharyngitis is treated with medicines that kill germs (antibiotics).  HOME CARE INSTRUCTIONS   Drink enough water and fluids to keep your urine clear or pale yellow.   Only take over-the-counter or prescription medicines as  directed by your health care provider:   If you are prescribed antibiotics, make sure you finish them even if you start to feel better.   Do not take aspirin.   Get lots of rest.   Gargle with 8 oz of salt water ( tsp of salt per 1 qt of water) as often as every 1-2 hours to soothe your throat.   Throat lozenges (if you are not at risk for choking) or sprays may be used to soothe your throat. SEEK MEDICAL CARE IF:   You have large, tender lumps in your neck.  You have a rash.  You cough up green, yellow-brown, or bloody spit. SEEK IMMEDIATE MEDICAL CARE IF:   Your neck becomes stiff.  You drool or are unable to swallow liquids.  You vomit or are unable to keep medicines or liquids down.  You have severe pain that does not go away with the use of recommended medicines.  You have trouble breathing (not caused by a stuffy nose). MAKE SURE YOU:   Understand these instructions.  Will watch your condition.  Will get help right away if you are not doing well or get worse.   This information is not intended to replace advice given to you by your health care provider. Make sure you discuss any questions you have with your health care provider.   Document Released: 03/12/2005 Document Revised: 12/31/2012 Document Reviewed: 11/17/2012 Elsevier Interactive Patient Education [employer]2016 [employer]

## 2015-10-13 NOTE — ED Notes (Signed)
C/o sorethroat x 3 days  Denies other complaints

## 2015-10-15 LAB — CULTURE, GROUP A STREP (THRC)

## 2016-06-01 ENCOUNTER — Encounter: Payer: Self-pay | Admitting: Emergency Medicine

## 2016-06-01 ENCOUNTER — Emergency Department
Admission: EM | Admit: 2016-06-01 | Discharge: 2016-06-01 | Disposition: A | Discharge status: Home / Self Care | Payer: Self-pay | Source: Home / Self Care | Attending: Family Medicine | Admitting: Family Medicine

## 2016-06-01 ENCOUNTER — Other Ambulatory Visit: Payer: Self-pay

## 2016-06-01 DIAGNOSIS — R0789 Other chest pain: Secondary | ICD-10-CM

## 2016-06-01 DIAGNOSIS — M79602 Pain in left arm: Secondary | ICD-10-CM

## 2016-06-01 DIAGNOSIS — F419 Anxiety disorder, unspecified: Secondary | ICD-10-CM

## 2016-06-01 DIAGNOSIS — F410 Panic disorder [episodic paroxysmal anxiety] without agoraphobia: Secondary | ICD-10-CM

## 2016-06-01 DIAGNOSIS — F439 Reaction to severe stress, unspecified: Secondary | ICD-10-CM

## 2016-06-01 NOTE — ED Provider Notes (Signed)
CSN: [id number]     Arrival date & time 06/01/16  1819 History   First MD Initiated Contact with Patient 06/01/16 1824     Chief Complaint  Patient presents with  . Chest Pain  . Arm Pain  . Jaw Pain   (Consider location/radiation/quality/duration/timing/severity/associated sxs/prior Treatment) HPI Bryan Gill is a 36 y.o. male presenting to UC with c/o pinching pain in his chest with tingling spasms in Left hand and pain in jaw.  Chest pain is a squeezing sensation.  He notes he did have a generalized HA earlier this morning and took ibuprofen with moderate relief. He notes he is under tremendous stress including both parents in poor health.  He has had little sleep in the past 3 days. Strong family hx of heart disease but pt has been worked up several times for similar chest pain and arm numbness, most recently on 04/19/16 at [facility] with 3 negative Troponins.  Pt was prescribed atarax for anxiety but states he cannot take it as it makes him drowsy even 12 hours after taking and results in chest pain starting as soon as the fatigue wears off.  It was recommended in January that he f/u with cardiology, however, due to lack of insurance and busy schedule, he has not f/u yet.  He currently only takes Zantac. No other medications.    Past Medical History:  Diagnosis Date  . Anxiety   . Asthma   . GERD (gastroesophageal reflux disease)   . MRSA (methicillin resistant staph aureus) culture positive    History reviewed. No pertinent surgical history. Family History  Problem Relation Age of Onset  . Heart failure Mother   . Heart failure Father    Social History  Substance Use Topics  . Smoking status: Former Games developer  . Smokeless tobacco: Never Used  . Alcohol use No    Review of Systems  Constitutional: Negative for chills, diaphoresis and fever.  HENT: Negative for congestion, ear pain, sore throat, trouble swallowing and voice change.   Respiratory: Negative for cough and  shortness of breath.   Cardiovascular: Positive for chest pain. Negative for palpitations.  Gastrointestinal: Negative for abdominal pain, diarrhea, nausea and vomiting.  Musculoskeletal: Positive for myalgias. Negative for arthralgias and back pain.  Skin: Negative for rash.  Neurological: Positive for numbness and headaches. Negative for dizziness, weakness and light-headedness.    Allergies  Bactrim; Cephalexin; and Vicodin [hydrocodone-acetaminophen]  Home Medications   Prior to Admission medications   Medication Sig Start Date End Date Taking? Authorizing Provider  buPROPion (WELLBUTRIN XL) 150 MG 24 hr tablet Take 150 mg by mouth daily.    Historical Provider, MD  diphenhydrAMINE (BENADRYL) 25 MG tablet Take 25 mg by mouth every 6 (six) hours as needed. allergy    Historical Provider, MD  doxycycline (VIBRAMYCIN) 100 MG capsule Take 1 capsule (100 mg total) by mouth 2 (two) times daily. One po bid x 7 days 11/10/13   [name], MD  hydrOXYzine (VISTARIL) 50 MG capsule Take 50 mg by mouth as needed.    Historical Provider, MD  ibuprofen (ADVIL,MOTRIN) 800 MG tablet Take 1 tablet (800 mg total) by mouth 3 (three) times daily. 12/22/14   [name], [name]  metoCLOPramide (REGLAN) 10 MG tablet Take 1 tablet (10 mg total) by mouth every 6 (six) hours. 03/12/14   [name], MD  ranitidine (ZANTAC) 150 MG tablet Take 150 mg by mouth 2 (two) times daily.  Historical Provider, MD  risperiDONE (RISPERDAL) 1 MG tablet Take 1 mg by mouth at bedtime.    Historical Provider, MD  temazepam (RESTORIL) 15 MG capsule Take 15 mg by mouth at bedtime as needed for sleep.    Historical Provider, MD   Meds Ordered and Administered this Visit  Medications - No data to display  BP 120/78 (BP Location: Left Arm)   Pulse 74   Temp 97.9 F (36.6 C) (Oral)   Resp 18   Ht 6\' 3"  (1.905 m)   Wt 220 lb (99.8 kg)   SpO2 100%   BMI 27.50 kg/m  No data found.   Physical Exam   Constitutional: He is oriented to person, place, and time. He appears well-developed and well-nourished. No distress.  Pt sitting in exam chair, NAD.   HENT:  Head: Normocephalic and atraumatic.  Eyes: Conjunctivae are normal. No scleral icterus.  Neck: Normal range of motion. Neck supple.  Cardiovascular: Normal rate, regular rhythm and normal heart sounds.   Pulmonary/Chest: Effort normal and breath sounds normal. No respiratory distress. He has no wheezes. He has no rales. He exhibits no tenderness.  Abdominal: Soft. He exhibits no distension. There is no tenderness.  Musculoskeletal: Normal range of motion.  Neurological: He is alert and oriented to person, place, and time.  CN II-XII in tact. Speech is clear. Alert to person, place and time. Normal gait.   Skin: Skin is warm and dry. He is not diaphoretic.  Nursing note and vitals reviewed.   Urgent Care Course     Procedures (including critical care time)  Labs Review Labs Reviewed - No data to display  Imaging Review No results found.  Date/Time:05/31/16   18:26:40 Ventricular Rate: 73 PR Interval: 142 QRS Duration: 90 QT Interval: 364 QTC Calculation: 401 P-R-T axes: 59   25   29 Text Interpretation: Normal sinus rhythm, early repolarization. Normal ECG  [facility]: Normal sinus rhythm with sinus arrhythmia, early repolarization, normal ECG.  No significant change since prior.  MDM   1. Atypical chest pain   2. Left arm pain   3. Anxiety   4. Panic attack   5. Stress    Pt c/o chest pain and Left arm numbness/tingling and spasms.    Pt appears well in UC. Normal neuro exam. EKG: unremarkable, unchanged since prior about 1 month ago.    About ACS or CVA at this time. Symptoms most c/w panic attack. Strongly recommend pt f/u with PCP and Cardiology.  Resources provided as well as resources for [facility] as pt use to be on  risperidone and Wellbutrin per medical records but is not longer taking any medication besides Zantac.  Discussed symptoms that warrant emergent care in the ED.     Junius Finnerrin O'Malley, [name] 06/01/16 1940

## 2016-06-01 NOTE — ED Triage Notes (Signed)
Patient was driving home from work; noticed pinching pain in chest with tingling spasms in left hand and pain in jaw. He is under tremendous stress and has had very little sleep in past 3 days. Strong family history of heart disease; has been worked up for cardiac problems in past but was determined to be muscular chest pain.

## 2016-06-14 ENCOUNTER — Encounter (HOSPITAL_BASED_OUTPATIENT_CLINIC_OR_DEPARTMENT_OTHER): Payer: Self-pay | Admitting: *Deleted

## 2016-06-14 ENCOUNTER — Emergency Department (HOSPITAL_BASED_OUTPATIENT_CLINIC_OR_DEPARTMENT_OTHER)
Admission: EM | Admit: 2016-06-14 | Discharge: 2016-06-14 | Disposition: A | Discharge status: Home / Self Care | Payer: Self-pay | Attending: Emergency Medicine | Admitting: Emergency Medicine

## 2016-06-14 DIAGNOSIS — Z87891 Personal history of nicotine dependence: Secondary | ICD-10-CM | POA: Insufficient documentation

## 2016-06-14 DIAGNOSIS — J45909 Unspecified asthma, uncomplicated: Secondary | ICD-10-CM | POA: Insufficient documentation

## 2016-06-14 DIAGNOSIS — K219 Gastro-esophageal reflux disease without esophagitis: Secondary | ICD-10-CM | POA: Insufficient documentation

## 2016-06-14 DIAGNOSIS — R1013 Epigastric pain: Secondary | ICD-10-CM

## 2016-06-14 DIAGNOSIS — Z79899 Other long term (current) drug therapy: Secondary | ICD-10-CM | POA: Insufficient documentation

## 2016-06-14 LAB — URINALYSIS, ROUTINE W REFLEX MICROSCOPIC
BILIRUBIN URINE: NEGATIVE
Glucose, UA: NEGATIVE mg/dL
HGB URINE DIPSTICK: NEGATIVE
Ketones, ur: NEGATIVE mg/dL
Leukocytes, UA: NEGATIVE
Nitrite: NEGATIVE
Protein, ur: NEGATIVE mg/dL
SPECIFIC GRAVITY, URINE: 1.021 (ref 1.005–1.030)
pH: 7 (ref 5.0–8.0)

## 2016-06-14 LAB — COMPREHENSIVE METABOLIC PANEL
ALT: 28 U/L (ref 17–63)
AST: 26 U/L (ref 15–41)
Albumin: 4.2 g/dL (ref 3.5–5.0)
Alkaline Phosphatase: 41 U/L (ref 38–126)
Anion gap: 8 (ref 5–15)
BUN: 12 mg/dL (ref 6–20)
CHLORIDE: 105 mmol/L (ref 101–111)
CO2: 26 mmol/L (ref 22–32)
CREATININE: 1.01 mg/dL (ref 0.61–1.24)
Calcium: 9 mg/dL (ref 8.9–10.3)
Glucose, Bld: 99 mg/dL (ref 65–99)
Potassium: 3.7 mmol/L (ref 3.5–5.1)
Sodium: 139 mmol/L (ref 135–145)
Total Bilirubin: 0.8 mg/dL (ref 0.3–1.2)
Total Protein: 7 g/dL (ref 6.5–8.1)

## 2016-06-14 LAB — CBC
HCT: 40.4 % (ref 39.0–52.0)
Hemoglobin: 13.8 g/dL (ref 13.0–17.0)
MCH: 28.3 pg (ref 26.0–34.0)
MCHC: 34.2 g/dL (ref 30.0–36.0)
MCV: 83 fL (ref 78.0–100.0)
PLATELETS: 174 10*3/uL (ref 150–400)
RBC: 4.87 MIL/uL (ref 4.22–5.81)
RDW: 13.9 % (ref 11.5–15.5)
WBC: 5 10*3/uL (ref 4.0–10.5)

## 2016-06-14 LAB — LIPASE, BLOOD: LIPASE: 31 U/L (ref 11–51)

## 2016-06-14 MED ORDER — GI COCKTAIL ~~LOC~~
30.0000 mL | Freq: Once | ORAL | Status: AC
  Administered 2016-06-14: 30 mL via ORAL
  Filled 2016-06-14: qty 30

## 2016-06-14 MED ORDER — ACETAMINOPHEN 325 MG PO TABS
650.0000 mg | ORAL_TABLET | Freq: Once | ORAL | Status: AC
  Administered 2016-06-14: 650 mg via ORAL
  Filled 2016-06-14: qty 2

## 2016-06-14 MED ORDER — PANTOPRAZOLE SODIUM 20 MG PO TBEC: 20.0000 mg | DELAYED_RELEASE_TABLET | Freq: Every day | ORAL | 0 refills | Status: DC

## 2016-06-14 MED ORDER — SUCRALFATE 1 GM/10ML PO SUSP: 1.0000 g | Freq: Three times a day (TID) | ORAL | 1 refills | Status: DC

## 2016-06-14 NOTE — ED Provider Notes (Signed)
[facility] Provider Note   CSN: [id number] Arrival date & time: 06/14/16  1848   By signing my name below, I, [name], attest that this documentation has been prepared under the direction and in the presence of [name], MD. Electronically Signed: [name], Scribe. 06/14/16. 8:57 PM.    History   Chief Complaint Chief Complaint  Patient presents with  . Abdominal Pain    HPI Bryan Gill is a 36 y.o. male who presents to the Emergency Department complaining of worsening, cramping, diffuse, upper abdominal pain that started 5 days ago. Pt has associated nausea, multiple episodes of  vomiting, constipation, headache. Pt describes the pain as gas pain like he has to go to the bathroom. Pt states that pressing down on his stomach makes him feel like he needs to go to the bathroom. Pt has h/o GERD and is on Zantac. Pt has had gallbladder surgery 1 year ago. Pt sees [facility] gastrology last seen 1 year ago. Pt denies urinary symptoms, leg swelling. Pt is allergic to Bactrim; Cephalexin; and Vicodin [hydrocodone-acetaminophen].  The history is provided by the patient. No language interpreter was used.    Past Medical History:  Diagnosis Date  . Anxiety   . Asthma   . GERD (gastroesophageal reflux disease)   . MRSA (methicillin resistant staph aureus) culture positive     There are no active problems to display for this patient.   Past Surgical History:  Procedure Laterality Date  . CHOLECYSTECTOMY    . HERNIA REPAIR         Home Medications    Prior to Admission medications   Medication Sig Start Date End Date Taking? Authorizing Provider  ranitidine (ZANTAC) 150 MG tablet Take 150 mg by mouth 2 (two) times daily.     Yes Historical Provider, MD  buPROPion (WELLBUTRIN XL) 150 MG 24 hr tablet Take 150 mg by mouth daily.    Historical Provider, MD  diphenhydrAMINE (BENADRYL) 25 MG tablet Take 25 mg by mouth every 6 (six) hours as needed. allergy     Historical Provider, MD  doxycycline (VIBRAMYCIN) 100 MG capsule Take 1 capsule (100 mg total) by mouth 2 (two) times daily. One po bid x 7 days 11/10/13   [name], MD  hydrOXYzine (VISTARIL) 50 MG capsule Take 50 mg by mouth as needed.    Historical Provider, MD  ibuprofen (ADVIL,MOTRIN) 800 MG tablet Take 1 tablet (800 mg total) by mouth 3 (three) times daily. 12/22/14   [name], [name]  metoCLOPramide (REGLAN) 10 MG tablet Take 1 tablet (10 mg total) by mouth every 6 (six) hours. 03/12/14   [name], MD  pantoprazole (PROTONIX) 20 MG tablet Take 1 tablet (20 mg total) by mouth daily. 06/14/16   [name], MD  risperiDONE (RISPERDAL) 1 MG tablet Take 1 mg by mouth at bedtime.    Historical Provider, MD  sucralfate (CARAFATE) 1 GM/10ML suspension Take 10 mLs (1 g total) by mouth 4 (four) times daily -  with meals and at bedtime. 06/14/16   [name], MD  temazepam (RESTORIL) 15 MG capsule Take 15 mg by mouth at bedtime as needed for sleep.    Historical Provider, MD    Family History Family History  Problem Relation Age of Onset  . Heart failure Mother   . Heart failure Father     Social History Social History  Substance Use Topics  . Smoking status: Former Games developer  . Smokeless tobacco: Never Used  .  Alcohol use No     Allergies   Bactrim; Cephalexin; and Vicodin [hydrocodone-acetaminophen]   Review of Systems Review of Systems  Gastrointestinal: Positive for abdominal pain.    A complete 10 system review of systems was obtained and all systems are negative except as noted in the HPI and PMH.    Physical Exam Updated Vital Signs BP 116/79 (BP Location: Left Arm)   Pulse 72   Temp 98.6 F (37 C) (Oral)   Resp 16   Ht 6\' 3"  (1.905 m)   Wt 220 lb (99.8 kg)   SpO2 100%   BMI 27.50 kg/m   Physical Exam  Constitutional: He is oriented to person, place, and time. He appears well-developed and well-nourished.  HENT:  Head: Normocephalic and  atraumatic.  Cardiovascular: Normal rate, regular rhythm, normal heart sounds and intact distal pulses.  Exam reveals no gallop and no friction rub.   No murmur heard. Pulmonary/Chest: Effort normal and breath sounds normal. No respiratory distress. He has no wheezes. He has no rales. He exhibits no tenderness.  Abdominal: Soft. He exhibits no distension. There is no tenderness. There is no guarding.  Soft, non-tender. No guarding.  Musculoskeletal: He exhibits no edema or tenderness.  Neurological: He is alert and oriented to person, place, and time. No cranial nerve deficit. He exhibits normal muscle tone. Coordination normal.  Skin: Skin is warm and dry.  Psychiatric: He has a normal mood and affect.  Nursing note and vitals reviewed.    ED Treatments / Results   DIAGNOSTIC STUDIES: Oxygen Saturation is 100% on room air, normal by my interpretation.    COORDINATION OF CARE: 8:54 PM Discussed treatment plan with pt at bedside and pt agreed to plan, which includes GERD medication and blood lab work.    Labs (all labs ordered are listed, but only abnormal results are displayed) Labs Reviewed  URINALYSIS, ROUTINE W REFLEX MICROSCOPIC - Abnormal; Notable for the following:       Result Value   APPearance CLOUDY (*)    All other components within normal limits  LIPASE, BLOOD  COMPREHENSIVE METABOLIC PANEL  CBC    EKG  EKG Interpretation None       Radiology No results found.  Procedures Procedures (including critical care time)  Medications Ordered in ED Medications  gi cocktail (Maalox,Lidocaine,Donnatal) (30 mLs Oral Given 06/14/16 2102)  acetaminophen (TYLENOL) tablet 650 mg (650 mg Oral Given 06/14/16 2118)     Initial Impression / Assessment and Plan / ED Course  I have reviewed the triage vital signs and the nursing notes.  Pertinent labs & imaging results that were available during my care of the patient were reviewed by me and considered in my medical  decision making (see chart for details).      Final Clinical Impressions(s) / ED Diagnoses   Final diagnoses:  Epigastric pain  Gastroesophageal reflux disease, esophagitis presence not specified   Patient is experiencing epigastric and upper abdominal discomfort. He has early saiety, and intermittently vomits with eating. His abdominal examination is soft and nontender. Labs are within normal limits. Patient reports he's had significant problems with reflux and has been told that he has esophageal narrowing that may need dilation. He has not followed up with gastroenterology since that diagnosis. At this time, I do not suspect acute surgical abdomen. Patient is clinically well in appearance. I will add for protonixs to his daily medications and Carafate. He has been taking ranitidine. Counseled on the need to  follow-up with gastroenterology as soon as possible. New Prescriptions New Prescriptions   PANTOPRAZOLE (PROTONIX) 20 MG TABLET    Take 1 tablet (20 mg total) by mouth daily.   SUCRALFATE (CARAFATE) 1 GM/10ML SUSPENSION    Take 10 mLs (1 g total) by mouth 4 (four) times daily -  with meals and at bedtime.       Arby BarretteMarcy Nidal Rivet, MD 06/14/16 2203

## 2016-06-14 NOTE — ED Triage Notes (Signed)
Abdominal pain x 4 days. Vomiting. Fever. Headache.

## 2016-06-14 NOTE — ED Notes (Signed)
Pt tolerating PO fluids

## 2016-06-14 NOTE — ED Notes (Signed)
ED Provider at bedside. 

## 2016-06-14 NOTE — ED Notes (Signed)
Pt verbalizes understanding of d/c instructions and denies any further needs at this time. 

## 2016-06-14 NOTE — ED Notes (Signed)
Pt c/o generalized upper abdominal pain for the last few days with n/v.  He states the vomiting has slowed down, two episodes today, and that he is trying to drink clear fluids and eat bland food.  Pt states he has not had a bowel movement in a week.  He had a fever of 100.6 this morning, took some ibuprofen and it has since resolved.  Pt does not have sick contacts that he knows of.  Previous hx of cholesystectomy and umbilical hernia repair.

## 2016-07-09 ENCOUNTER — Emergency Department (HOSPITAL_BASED_OUTPATIENT_CLINIC_OR_DEPARTMENT_OTHER)
Admission: EM | Admit: 2016-07-09 | Discharge: 2016-07-09 | Disposition: A | Discharge status: Home / Self Care | Payer: Self-pay | Attending: Emergency Medicine | Admitting: Emergency Medicine

## 2016-07-09 ENCOUNTER — Encounter (HOSPITAL_BASED_OUTPATIENT_CLINIC_OR_DEPARTMENT_OTHER): Payer: Self-pay | Admitting: *Deleted

## 2016-07-09 DIAGNOSIS — R112 Nausea with vomiting, unspecified: Secondary | ICD-10-CM | POA: Insufficient documentation

## 2016-07-09 DIAGNOSIS — L0291 Cutaneous abscess, unspecified: Secondary | ICD-10-CM

## 2016-07-09 DIAGNOSIS — Z79899 Other long term (current) drug therapy: Secondary | ICD-10-CM | POA: Insufficient documentation

## 2016-07-09 DIAGNOSIS — L02811 Cutaneous abscess of head [any part, except face]: Secondary | ICD-10-CM | POA: Insufficient documentation

## 2016-07-09 DIAGNOSIS — R079 Chest pain, unspecified: Secondary | ICD-10-CM | POA: Insufficient documentation

## 2016-07-09 DIAGNOSIS — J45909 Unspecified asthma, uncomplicated: Secondary | ICD-10-CM | POA: Insufficient documentation

## 2016-07-09 DIAGNOSIS — Z87891 Personal history of nicotine dependence: Secondary | ICD-10-CM | POA: Insufficient documentation

## 2016-07-09 LAB — CBC
HCT: 42.3 % (ref 39.0–52.0)
HEMOGLOBIN: 14.6 g/dL (ref 13.0–17.0)
MCH: 28.5 pg (ref 26.0–34.0)
MCHC: 34.5 g/dL (ref 30.0–36.0)
MCV: 82.6 fL (ref 78.0–100.0)
Platelets: 169 10*3/uL (ref 150–400)
RBC: 5.12 MIL/uL (ref 4.22–5.81)
RDW: 14.1 % (ref 11.5–15.5)
WBC: 7.5 10*3/uL (ref 4.0–10.5)

## 2016-07-09 LAB — BASIC METABOLIC PANEL
ANION GAP: 5 (ref 5–15)
BUN: 14 mg/dL (ref 6–20)
CO2: 30 mmol/L (ref 22–32)
Calcium: 9.2 mg/dL (ref 8.9–10.3)
Chloride: 103 mmol/L (ref 101–111)
Creatinine, Ser: 1.21 mg/dL (ref 0.61–1.24)
Glucose, Bld: 96 mg/dL (ref 65–99)
POTASSIUM: 4.3 mmol/L (ref 3.5–5.1)
SODIUM: 138 mmol/L (ref 135–145)

## 2016-07-09 LAB — TROPONIN I: Troponin I: <0.03 ng/mL (ref <0.03)

## 2016-07-09 MED ORDER — CYCLOBENZAPRINE HCL 10 MG PO TABS: 10.0000 mg | ORAL_TABLET | Freq: Every evening | ORAL | 0 refills | Status: DC | PRN

## 2016-07-09 NOTE — ED Provider Notes (Signed)
[facility] Provider Note   CSN: [id number] Arrival date & time: 07/09/16  1453   By signing my name below, I, Bryan Gill, attest that this documentation has been prepared under the direction and in the presence of [name], [name]. Electronically Signed: Talbert Gill, Scribe. 07/09/16. 4:55 PM.    History   Chief Complaint Chief Complaint  Patient presents with  . Chest Pain    HPI Bryan Gill is a 36 y.o. male with h/o GERD who presents to the Emergency Department complaining of intermittent left sided chest pain. Pt reports it feels like "pins and needles" and it started 5 days ago. The pain is described as a dull ache, and changes to a squeezing. Nothing makes the pain better or worse. Pt has associated headaches, nausea, vomiting. Pt has taken ibuprofen for the pain before without any change. Pt has had this before and been checked out for this before, but was told he was fine. In 2005 he saw a cardiologist and had a stress test which was normal. He was seen at [facility] yesterday and work up was normal but pt eloped due to wait time. Pt states that he has a cold right now. Pt denies recent travel. Pt is a nonsmoker. Pt does not take any medications. Pt denies any blood in stool, abdominal pain. Father has CHF, Mother has heart disease. Pt does not have a PCP.   Pt has an abscess on the posterior aspect of his head. Pt has placed a hot compress on it PTA.   The history is provided by the patient. No language interpreter was used.    Past Medical History:  Diagnosis Date  . Anxiety   . Asthma   . GERD (gastroesophageal reflux disease)   . MRSA (methicillin resistant staph aureus) culture positive     There are no active problems to display for this patient.   Past Surgical History:  Procedure Laterality Date  . CHOLECYSTECTOMY    . HERNIA REPAIR         Home Medications    Prior to Admission medications   Medication Sig Start Date End Date Taking?  Authorizing Provider  buPROPion (WELLBUTRIN XL) 150 MG 24 hr tablet Take 150 mg by mouth daily.    Historical Provider, MD  diphenhydrAMINE (BENADRYL) 25 MG tablet Take 25 mg by mouth every 6 (six) hours as needed. allergy    Historical Provider, MD  doxycycline (VIBRAMYCIN) 100 MG capsule Take 1 capsule (100 mg total) by mouth 2 (two) times daily. One po bid x 7 days 11/10/13   [name], MD  hydrOXYzine (VISTARIL) 50 MG capsule Take 50 mg by mouth as needed.    Historical Provider, MD  ibuprofen (ADVIL,MOTRIN) 800 MG tablet Take 1 tablet (800 mg total) by mouth 3 (three) times daily. 12/22/14   [name], [name]  metoCLOPramide (REGLAN) 10 MG tablet Take 1 tablet (10 mg total) by mouth every 6 (six) hours. 03/12/14   [name], MD  pantoprazole (PROTONIX) 20 MG tablet Take 1 tablet (20 mg total) by mouth daily. 06/14/16   [name], MD  ranitidine (ZANTAC) 150 MG tablet Take 150 mg by mouth 2 (two) times daily.      Historical Provider, MD  risperiDONE (RISPERDAL) 1 MG tablet Take 1 mg by mouth at bedtime.    Historical Provider, MD  sucralfate (CARAFATE) 1 GM/10ML suspension Take 10 mLs (1 g total) by mouth 4 (four) times daily -  with meals  and at bedtime. 06/14/16   [name], MD  temazepam (RESTORIL) 15 MG capsule Take 15 mg by mouth at bedtime as needed for sleep.    Historical Provider, MD    Family History Family History  Problem Relation Age of Onset  . Heart failure Mother   . Heart failure Father     Social History Social History  Substance Use Topics  . Smoking status: Former Games developer  . Smokeless tobacco: Never Used  . Alcohol use No     Allergies   Bactrim; Cephalexin; and Vicodin [hydrocodone-acetaminophen]   Review of Systems Review of Systems  Cardiovascular: Positive for chest pain.  Gastrointestinal: Positive for nausea and vomiting.  Neurological: Positive for headaches.  All other systems reviewed and are negative.    Physical  Exam Updated Vital Signs BP 116/79   Pulse 74   Temp 98.5 F (36.9 C) (Oral)   Resp 12   Ht  (1.905 m)   Wt 220 lb (99.8 kg)   SpO2 100%   BMI 27.50 kg/m   Physical Exam  Constitutional: He is oriented to person, place, and time. He appears well-developed and well-nourished. No distress.  HENT:  Head: Normocephalic and atraumatic.  Eyes: Conjunctivae are normal. Pupils are equal, round, and reactive to light. Right eye exhibits no discharge. Left eye exhibits no discharge. No scleral icterus.  Neck: Normal range of motion.  Cardiovascular: Normal rate, regular rhythm, normal heart sounds and intact distal pulses.  Exam reveals no gallop and no friction rub.   No murmur heard. Pulmonary/Chest: Effort normal and breath sounds normal. No respiratory distress. He has no wheezes. He has no rales. He exhibits no tenderness.  Abdominal: Soft. Bowel sounds are normal. He exhibits no distension and no mass. There is no tenderness. There is no rebound and no guarding. No hernia.  Neurological: He is alert and oriented to person, place, and time.  Skin: Skin is warm and dry.  1 cm abscess on the posterior aspect of the left side of his head.  Psychiatric: He has a normal mood and affect. His behavior is normal.  Nursing note and vitals reviewed.    ED Treatments / Results   DIAGNOSTIC STUDIES: Oxygen Saturation is 97% on room air, adequate by my interpretation.    COORDINATION OF CARE: 4:50 PM Discussed treatment plan with pt at bedside and pt agreed to plan, which includes EKG, normal labs. Pt has been told to follow up with cardiology.   Labs (all labs ordered are listed, but only abnormal results are displayed) Labs Reviewed  BASIC METABOLIC PANEL  CBC  TROPONIN I    EKG  EKG Interpretation None       Radiology No results found.  Procedures Procedures (including critical care time)  INCISION AND DRAINAGE Performed by: Bethel Born Consent: Verbal  consent obtained. Risks and benefits: risks, benefits and alternatives were discussed Type: abscess  Body area: left posterior scalp  Anesthesia: none  Incision was made with a scalpel.  Local anesthetic: none  Anesthetic total: n/a  Complexity: simple Blunt dissection to break up loculations  Drainage: bloody with minimal purulent drainage  Drainage amount: <1cc  Packing material: none  Patient tolerance: Patient tolerated the procedure well with no immediate complications.     Medications Ordered in ED Medications - No data to display   Initial Impression / Assessment and Plan / ED Course   I have reviewed the triage vital signs and the nursing notes.  Pertinent labs & imaging results that were available during my care of the patient were reviewed by me and considered in my medical decision making (see chart for details).  Chest pain work up is reassuring. Doubt ACS, PE, pericarditis, esophageal rupture, tension pneumothorax, aortic dissection, cardiac tamponade. EKG is NSR and shows no significant change since last. CXR is negative. Troponin from yesterday was normal and troponin today is 0. Labs are unremarkable. Patient is non-smoker. F/u with Cardiology.  I&D was performed on small abscess on head as well. Very minimal drainage was expressed. Advised continue warm compresses. Return precautions given.   Final Clinical Impressions(s) / ED Diagnoses   Final diagnoses:  Nonspecific chest pain  Abscess    New Prescriptions New Prescriptions   No medications on file   I personally performed the services described in this documentation, which was scribed in my presence. The recorded information has been reviewed and is accurate.     Bethel Born, [name] 07/10/16 [id number]    Geoffery Lyons, MD 07/11/16 Marlyne Beards

## 2016-07-09 NOTE — ED Triage Notes (Signed)
Pt c/o mid sternal CP x 5 days, sen at [facility] ED for  same  X 3 days ago

## 2016-07-09 NOTE — Discharge Instructions (Signed)
Please follow up with cardiology

## 2017-01-01 ENCOUNTER — Emergency Department (HOSPITAL_BASED_OUTPATIENT_CLINIC_OR_DEPARTMENT_OTHER)
Admission: EM | Admit: 2017-01-01 | Discharge: 2017-01-01 | Disposition: A | Discharge status: Home / Self Care | Payer: Self-pay | Attending: Emergency Medicine | Admitting: Emergency Medicine

## 2017-01-01 ENCOUNTER — Encounter (HOSPITAL_BASED_OUTPATIENT_CLINIC_OR_DEPARTMENT_OTHER): Payer: Self-pay | Admitting: *Deleted

## 2017-01-01 DIAGNOSIS — Z79899 Other long term (current) drug therapy: Secondary | ICD-10-CM | POA: Insufficient documentation

## 2017-01-01 DIAGNOSIS — K529 Noninfective gastroenteritis and colitis, unspecified: Secondary | ICD-10-CM | POA: Insufficient documentation

## 2017-01-01 DIAGNOSIS — B349 Viral infection, unspecified: Secondary | ICD-10-CM | POA: Insufficient documentation

## 2017-01-01 DIAGNOSIS — J45909 Unspecified asthma, uncomplicated: Secondary | ICD-10-CM | POA: Insufficient documentation

## 2017-01-01 DIAGNOSIS — Z87891 Personal history of nicotine dependence: Secondary | ICD-10-CM | POA: Insufficient documentation

## 2017-01-01 LAB — URINALYSIS, ROUTINE W REFLEX MICROSCOPIC
Bilirubin Urine: NEGATIVE
GLUCOSE, UA: NEGATIVE mg/dL
Hgb urine dipstick: NEGATIVE
KETONES UR: NEGATIVE mg/dL
Leukocytes, UA: NEGATIVE
NITRITE: NEGATIVE
PH: 7 (ref 5.0–8.0)
PROTEIN: NEGATIVE mg/dL
Specific Gravity, Urine: <1.005 — ABNORMAL LOW (ref 1.005–1.030)

## 2017-01-01 LAB — COMPREHENSIVE METABOLIC PANEL
ALBUMIN: 4.2 g/dL (ref 3.5–5.0)
ALT: 42 U/L (ref 17–63)
ANION GAP: 7 (ref 5–15)
AST: 33 U/L (ref 15–41)
Alkaline Phosphatase: 45 U/L (ref 38–126)
BILIRUBIN TOTAL: 0.8 mg/dL (ref 0.3–1.2)
BUN: 9 mg/dL (ref 6–20)
CO2: 28 mmol/L (ref 22–32)
Calcium: 9 mg/dL (ref 8.9–10.3)
Chloride: 105 mmol/L (ref 101–111)
Creatinine, Ser: 1.27 mg/dL — ABNORMAL HIGH (ref 0.61–1.24)
GFR calc Af Amer: >60 mL/min (ref >60)
GFR calc non Af Amer: >60 mL/min (ref >60)
GLUCOSE: 103 mg/dL — AB (ref 65–99)
POTASSIUM: 4.2 mmol/L (ref 3.5–5.1)
SODIUM: 140 mmol/L (ref 135–145)
TOTAL PROTEIN: 7.5 g/dL (ref 6.5–8.1)

## 2017-01-01 LAB — CBC WITH DIFFERENTIAL/PLATELET
BASOS ABS: 0 10*3/uL (ref 0.0–0.1)
BASOS PCT: 0 %
EOS ABS: 0.1 10*3/uL (ref 0.0–0.7)
Eosinophils Relative: 2 %
HEMATOCRIT: 44.3 % (ref 39.0–52.0)
Hemoglobin: 14.7 g/dL (ref 13.0–17.0)
LYMPHS ABS: 2 10*3/uL (ref 0.7–4.0)
LYMPHS PCT: 40 %
MCH: 27.3 pg (ref 26.0–34.0)
MCHC: 33.2 g/dL (ref 30.0–36.0)
MCV: 82.2 fL (ref 78.0–100.0)
MONOS PCT: 8 %
Monocytes Absolute: 0.4 10*3/uL (ref 0.1–1.0)
NEUTROS ABS: 2.5 10*3/uL (ref 1.7–7.7)
NEUTROS PCT: 50 %
PLATELETS: 168 10*3/uL (ref 150–400)
RBC: 5.39 MIL/uL (ref 4.22–5.81)
RDW: 13.9 % (ref 11.5–15.5)
WBC: 5 10*3/uL (ref 4.0–10.5)

## 2017-01-01 LAB — LIPASE, BLOOD: Lipase: 36 U/L (ref 11–51)

## 2017-01-01 MED ORDER — SODIUM CHLORIDE 0.9 % IV SOLN
INTRAVENOUS | Status: DC
  Administered 2017-01-01: 09:00:00 via INTRAVENOUS

## 2017-01-01 MED ORDER — PROMETHAZINE HCL 25 MG PO TABS: 25.0000 mg | ORAL_TABLET | Freq: Four times a day (QID) | ORAL | 1 refills | Status: DC | PRN

## 2017-01-01 MED ORDER — SODIUM CHLORIDE 0.9 % IV BOLUS (SEPSIS)
1000.0000 mL | Freq: Once | INTRAVENOUS | Status: AC
  Administered 2017-01-01: 1000 mL via INTRAVENOUS

## 2017-01-01 MED ORDER — SODIUM CHLORIDE 0.9 % IV SOLN: INTRAVENOUS | Status: DC

## 2017-01-01 MED ORDER — ONDANSETRON HCL 4 MG/2ML IJ SOLN
4.0000 mg | Freq: Once | INTRAMUSCULAR | Status: AC
  Administered 2017-01-01: 4 mg via INTRAVENOUS
  Filled 2017-01-01: qty 2

## 2017-01-01 NOTE — ED Triage Notes (Signed)
Pt with N/V D since Sat as well as fever reports highest fever of 101.0 24 hours ago

## 2017-01-01 NOTE — Discharge Instructions (Signed)
Take the Phenergan as needed for nausea and vomiting. Rest and drink plenty of fluids. Work note provided. Return for any new or worse symptoms. Today's labs without any significant abnormalities.

## 2017-01-01 NOTE — ED Provider Notes (Signed)
[facility] Provider Note   CSN: [id number] Arrival date & time: 01/01/17  1478     History   Chief Complaint Chief Complaint  Patient presents with  . Emesis  . Fever    HPI Bryan Gill is a 36 y.o. male.  Patient with the onset of illness on Friday on Saturday had nausea and vomiting numerous times with bloating feeling in the stomach and a dull ache. On Sunday diarrhea started and had a proximally 4 episodes of diarrhea. On Monday vomiting continued. Associated with fever and chills. No blood in bowel movements or with vomiting. No sick exposure. No history of similar problem. Past medical history noncontributory. Patient does have a history of reflux disease.      Past Medical History:  Diagnosis Date  . Anxiety   . Asthma   . GERD (gastroesophageal reflux disease)   . MRSA (methicillin resistant staph aureus) culture positive     There are no active problems to display for this patient.   Past Surgical History:  Procedure Laterality Date  . CHOLECYSTECTOMY    . HERNIA REPAIR         Home Medications    Prior to Admission medications   Medication Sig Start Date End Date Taking? Authorizing Provider  hydrOXYzine (VISTARIL) 50 MG capsule Take 50 mg by mouth as needed.    [provider]  ibuprofen (ADVIL,MOTRIN) 800 MG tablet Take 1 tablet (800 mg total) by mouth 3 (three) times daily. 12/22/14   [name], [name]  promethazine (PHENERGAN) 25 MG tablet Take 1 tablet (25 mg total) by mouth every 6 (six) hours as needed for nausea or vomiting. 01/01/17   Vanetta Mulders, MD    Family History Family History  Problem Relation Age of Onset  . Heart failure Mother   . Heart failure Father     Social History Social History  Substance Use Topics  . Smoking status: Former Games developer  . Smokeless tobacco: Never Used  . Alcohol use No     Allergies   Bactrim; Cephalexin; and Vicodin [hydrocodone-acetaminophen]   Review of  Systems Review of Systems  Constitutional: Positive for chills and fever.  HENT: Positive for congestion.   Eyes: Negative for redness.  Respiratory: Negative for shortness of breath.   Cardiovascular: Negative for chest pain.  Gastrointestinal: Positive for abdominal pain, diarrhea, nausea and vomiting.  Genitourinary: Negative for dysuria.  Musculoskeletal: Negative for myalgias.  Skin: Negative for rash.  Neurological: Negative for headaches.  Hematological: Does not bruise/bleed easily.  Psychiatric/Behavioral: Negative for confusion.     Physical Exam Updated Vital Signs BP 108/64 (BP Location: Right Arm)   Pulse 73   Temp 98.5 F (36.9 C) (Oral)   Resp 16   Ht 1.905 m ( )   Wt 106.6 kg (235 lb)   SpO2 100%   BMI 29.37 kg/m   Physical Exam  Constitutional: He is oriented to person, place, and time. He appears well-developed and well-nourished. No distress.  HENT:  Head: Normocephalic and atraumatic.  Mouth/Throat: Oropharynx is clear and moist.  Eyes: Pupils are equal, round, and reactive to light. Conjunctivae and EOM are normal.  Neck: Normal range of motion. Neck supple.  Cardiovascular: Normal rate.   Pulmonary/Chest: Effort normal and breath sounds normal. No respiratory distress.  Abdominal: Soft. Bowel sounds are normal. There is no tenderness.  Musculoskeletal: Normal range of motion.  Neurological: He is alert and oriented to person, place, and time. No cranial nerve  deficit or sensory deficit. He exhibits normal muscle tone. Coordination normal.  Skin: Skin is warm. No rash noted.  Nursing note and vitals reviewed.    ED Treatments / Results  Labs (all labs ordered are listed, but only abnormal results are displayed) Labs Reviewed  COMPREHENSIVE METABOLIC PANEL - Abnormal; Notable for the following:       Result Value   Glucose, Bld 103 (*)    Creatinine, Ser 1.27 (*)    All other components within normal limits  URINALYSIS, ROUTINE W REFLEX  MICROSCOPIC - Abnormal; Notable for the following:    Specific Gravity, Urine <1.005 (*)    All other components within normal limits  CBC WITH DIFFERENTIAL/PLATELET  LIPASE, BLOOD    EKG  EKG Interpretation None       Radiology No results found.  Procedures Procedures (including critical care time)  Medications Ordered in ED Medications  0.9 %  sodium chloride infusion ( Intravenous New Bag/Given 01/01/17 0852)  sodium chloride 0.9 % bolus 1,000 mL (0 mLs Intravenous Stopped 01/01/17 0830)  sodium chloride 0.9 % bolus 1,000 mL (0 mLs Intravenous Stopped 01/01/17 0854)  ondansetron (ZOFRAN) injection 4 mg (4 mg Intravenous Given 01/01/17 0852)     Initial Impression / Assessment and Plan / ED Course  I have reviewed the triage vital signs and the nursing notes.  Pertinent labs & imaging results that were available during my care of the patient were reviewed by me and considered in my medical decision making (see chart for details).    Patient symptoms seem to be consistent with gastroenteritis. Abdomen is soft nontender. No concerns for an acute abdominal process. Patient's labs without significant abnormalities. Patient improved here with 2 L of fluid antinausea medicine. Patient will be treated symptomatically. Patient has not had any diarrhea past 24 hours.   Final Clinical Impressions(s) / ED Diagnoses   Final diagnoses:  Gastroenteritis  Viral illness    New Prescriptions New Prescriptions   PROMETHAZINE (PHENERGAN) 25 MG TABLET    Take 1 tablet (25 mg total) by mouth every 6 (six) hours as needed for nausea or vomiting.     Vanetta Mulders, MD 01/01/17 [phone]

## 2017-02-03 ENCOUNTER — Other Ambulatory Visit: Payer: Self-pay

## 2017-02-03 ENCOUNTER — Emergency Department (HOSPITAL_BASED_OUTPATIENT_CLINIC_OR_DEPARTMENT_OTHER): Payer: Self-pay

## 2017-02-03 ENCOUNTER — Emergency Department (HOSPITAL_BASED_OUTPATIENT_CLINIC_OR_DEPARTMENT_OTHER)
Admission: EM | Admit: 2017-02-03 | Discharge: 2017-02-03 | Disposition: A | Discharge status: Home / Self Care | Payer: Self-pay | Attending: Emergency Medicine | Admitting: Emergency Medicine

## 2017-02-03 ENCOUNTER — Encounter (HOSPITAL_BASED_OUTPATIENT_CLINIC_OR_DEPARTMENT_OTHER): Payer: Self-pay | Admitting: Emergency Medicine

## 2017-02-03 DIAGNOSIS — J069 Acute upper respiratory infection, unspecified: Secondary | ICD-10-CM | POA: Insufficient documentation

## 2017-02-03 DIAGNOSIS — Z79899 Other long term (current) drug therapy: Secondary | ICD-10-CM | POA: Insufficient documentation

## 2017-02-03 DIAGNOSIS — J45909 Unspecified asthma, uncomplicated: Secondary | ICD-10-CM | POA: Insufficient documentation

## 2017-02-03 DIAGNOSIS — Z87891 Personal history of nicotine dependence: Secondary | ICD-10-CM | POA: Insufficient documentation

## 2017-02-03 DIAGNOSIS — B349 Viral infection, unspecified: Secondary | ICD-10-CM | POA: Insufficient documentation

## 2017-02-03 MED ORDER — PREDNISONE 20 MG PO TABS
40.0000 mg | ORAL_TABLET | Freq: Once | ORAL | Status: AC
  Administered 2017-02-03: 40 mg via ORAL
  Filled 2017-02-03: qty 2

## 2017-02-03 MED ORDER — BENZONATATE 100 MG PO CAPS: 100.0000 mg | ORAL_CAPSULE | Freq: Three times a day (TID) | ORAL | 0 refills | Status: DC

## 2017-02-03 MED ORDER — BENZONATATE 100 MG PO CAPS
200.0000 mg | ORAL_CAPSULE | Freq: Once | ORAL | Status: AC
  Administered 2017-02-03: 200 mg via ORAL
  Filled 2017-02-03: qty 2

## 2017-02-03 MED ORDER — PREDNISONE 20 MG PO TABS
40.0000 mg | ORAL_TABLET | Freq: Every day | ORAL | 0 refills | Status: DC
Start: 2017-02-03 — End: 2018-05-29

## 2017-02-03 NOTE — ED Provider Notes (Signed)
[facility] EMERGENCY DEPARTMENT Provider Note   CSN: [id number] Arrival date & time: 02/03/17  [id number]     History   Chief Complaint Chief Complaint  Patient presents with  . Cough  . Sore Throat    HPI Bryan Gill is a 36 y.o. male.  HPI  The patient is a 36 year old male, he presents to the hospital after having 1 week of a persistent cough with a sore throat, feeling hoarse in the voice today.  He denies fevers but has had some chills and denies any muscle aches.  No congestion, no swelling of the legs, he does have some seasonal asthma and has been taking albuterol because of the tightness in his chest when he breathes and coughs.  Today was the first day that he had some productive yellow phlegm or mucus.  He has been trying some other over-the-counter medications including hot tea and honey as well but this does not seem to improve his symptoms.  Past Medical History:  Diagnosis Date  . Anxiety   . Asthma   . GERD (gastroesophageal reflux disease)   . MRSA (methicillin resistant staph aureus) culture positive     There are no active problems to display for this patient.   Past Surgical History:  Procedure Laterality Date  . CHOLECYSTECTOMY    . HERNIA REPAIR         Home Medications    Prior to Admission medications   Medication Sig Start Date End Date Taking? Authorizing Provider  benzonatate (TESSALON) 100 MG capsule Take 1 capsule (100 mg total) every 8 (eight) hours by mouth. 02/03/17   Eber HongMiller, Jaray Boliver, MD  ibuprofen (ADVIL,MOTRIN) 800 MG tablet Take 1 tablet (800 mg total) by mouth 3 (three) times daily. 12/22/14   Mady GemmaWestfall, Elizabeth C, [name]  predniSONE (DELTASONE) 20 MG tablet Take 2 tablets (40 mg total) daily by mouth. 02/03/17   Eber HongMiller, Ericah Scotto, MD    Family History Family History  Problem Relation Age of Onset  . Heart failure Mother   . Heart failure Father     Social History Social History   Tobacco Use  . Smoking status:  Former Games developermoker  . Smokeless tobacco: Never Used  Substance Use Topics  . Alcohol use: No  . Drug use: Yes    Types: Marijuana     Allergies   Bactrim; Cephalexin; and Vicodin [hydrocodone-acetaminophen]   Review of Systems Review of Systems  Constitutional: Positive for chills. Negative for fever.  HENT: Positive for sore throat.   Eyes: Negative for redness.  Respiratory: Positive for cough, chest tightness and shortness of breath.   Cardiovascular: Negative for leg swelling.     Physical Exam Updated Vital Signs BP 116/75 (BP Location: Left Arm)   Pulse 64   Temp 98.3 F (36.8 C) (Oral)   Resp 16   Ht 6\' 3"  (1.905 m)   Wt 106.1 kg (234 lb)   SpO2 100%   BMI 29.25 kg/m   Physical Exam  Constitutional: He appears well-developed and well-nourished.  HENT:  Head: Normocephalic and atraumatic.  Oropharynx is clear and moist, there is mild erythema across the posterior pharynx but no exudate asymmetry or hypertrophy.  The uvula is slightly elongated and minimally edematous.  It is midline.  Phonation is normal  Eyes: Conjunctivae are normal. Right eye exhibits no discharge. Left eye exhibits no discharge.  Cardiovascular: Normal rate, regular rhythm and normal heart sounds.  Pulmonary/Chest: Effort normal. No respiratory distress.  The patient is  able to speak in full sentences, he has no expiratory wheezing or rales on inspiration.  He does have the occasional barky cough  Musculoskeletal: He exhibits no edema.  Neurological: He is alert. Coordination normal.  Skin: Skin is warm and dry. No rash noted. He is not diaphoretic. No erythema.  Psychiatric: He has a normal mood and affect.  Nursing note and vitals reviewed.    ED Treatments / Results  Labs (all labs ordered are listed, but only abnormal results are displayed) Labs Reviewed - No data to display   Radiology Dg Chest 2 View  Result Date: 02/03/2017 CLINICAL DATA:  Cough and congestion EXAM: CHEST  2  VIEW COMPARISON:  April 19, 2016 FINDINGS: Lungs are clear. Heart size and pulmonary vascularity are normal. No adenopathy. No evident bone lesions. IMPRESSION: No edema or consolidation. Electronically Signed   By: Bretta BangWilliam  Woodruff III M.D.   On: 02/03/2017 07:59    Procedures Procedures (including critical care time)  Medications Ordered in ED Medications  predniSONE (DELTASONE) tablet 40 mg (40 mg Oral Given 02/03/17 0801)  benzonatate (TESSALON) capsule 200 mg (200 mg Oral Given 02/03/17 0802)     Initial Impression / Assessment and Plan / ED Course  I have reviewed the triage vital signs and the nursing notes.  Pertinent labs & imaging results that were available during my care of the patient were reviewed by me and considered in my medical decision making (see chart for details).     We will obtain an x-ray to rule out pneumonia given the progressive and worsening cough now with chills however his symptoms are more likely to be viral given the presence of a sore throat and the hoarseness.  His throat does not appear consistent with strep throat.  He is not febrile.  He has been using albuterol inhaler and I think he will need prednisone and Tessalon in addition to that with an antibiotic only if his chest x-ray shows pneumonia.  He is in agreement with the plan.  The x-ray was negative for acute findings including pneumonia, no pneumothorax, vital signs are reassuring, patient instructed on treatment plan and indications for return  Vitals:   02/03/17 0735  BP: 116/75  Pulse: 64  Resp: 16  Temp: 98.3 F (36.8 C)  TempSrc: Oral  SpO2: 100%  Weight: 106.1 kg (234 lb)  Height: 6\' 3"  (1.905 m)     Final Clinical Impressions(s) / ED Diagnoses   Final diagnoses:  Viral upper respiratory tract infection    ED Discharge Orders        Ordered    benzonatate (TESSALON) 100 MG capsule  Every 8 hours     02/03/17 0819    predniSONE (DELTASONE) 20 MG tablet  Daily      02/03/17 0819       Eber HongMiller, Oza Oberle, MD 02/03/17 [phone]

## 2017-02-03 NOTE — ED Triage Notes (Signed)
Cough with yellow sputum and sore throat x 1 week.

## 2017-02-03 NOTE — Discharge Instructions (Signed)
Tessalon, 100 mg or 200 mg every 8 hours as needed for coughing  Albuterol, 2 puffs every 4 hours as needed for coughing wheezing or shortness of breath  Prednisone, 40 mg daily for 5 days.  Seek medical attention for severe or worsening symptoms to be aware that your cough may last for another week or so.  Your x-ray did not show any signs of pneumonia thankfully

## 2017-02-22 ENCOUNTER — Emergency Department (HOSPITAL_BASED_OUTPATIENT_CLINIC_OR_DEPARTMENT_OTHER): Payer: No Typology Code available for payment source

## 2017-02-22 ENCOUNTER — Encounter (HOSPITAL_BASED_OUTPATIENT_CLINIC_OR_DEPARTMENT_OTHER): Payer: Self-pay | Admitting: *Deleted

## 2017-02-22 ENCOUNTER — Emergency Department (HOSPITAL_BASED_OUTPATIENT_CLINIC_OR_DEPARTMENT_OTHER)
Admission: EM | Admit: 2017-02-22 | Discharge: 2017-02-22 | Disposition: A | Discharge status: Home / Self Care | Payer: No Typology Code available for payment source | Attending: Emergency Medicine | Admitting: Emergency Medicine

## 2017-02-22 DIAGNOSIS — J45909 Unspecified asthma, uncomplicated: Secondary | ICD-10-CM | POA: Diagnosis not present

## 2017-02-22 DIAGNOSIS — R509 Fever, unspecified: Secondary | ICD-10-CM | POA: Insufficient documentation

## 2017-02-22 DIAGNOSIS — Z87891 Personal history of nicotine dependence: Secondary | ICD-10-CM | POA: Diagnosis not present

## 2017-02-22 DIAGNOSIS — Z79899 Other long term (current) drug therapy: Secondary | ICD-10-CM | POA: Insufficient documentation

## 2017-02-22 DIAGNOSIS — R05 Cough: Secondary | ICD-10-CM | POA: Insufficient documentation

## 2017-02-22 DIAGNOSIS — R6889 Other general symptoms and signs: Secondary | ICD-10-CM

## 2017-02-22 DIAGNOSIS — J029 Acute pharyngitis, unspecified: Secondary | ICD-10-CM | POA: Diagnosis not present

## 2017-02-22 LAB — RAPID STREP SCREEN (MED CTR MEBANE ONLY): STREPTOCOCCUS, GROUP A SCREEN (DIRECT): NEGATIVE

## 2017-02-22 MED ORDER — OSELTAMIVIR PHOSPHATE 75 MG PO CAPS: 75.0000 mg | ORAL_CAPSULE | Freq: Two times a day (BID) | ORAL | 0 refills | Status: DC

## 2017-02-22 MED ORDER — ONDANSETRON 4 MG PO TBDP: 4.0000 mg | ORAL_TABLET | Freq: Three times a day (TID) | ORAL | 0 refills | Status: DC | PRN

## 2017-02-22 NOTE — ED Provider Notes (Signed)
TIME SEEN: 5:45 AM  CHIEF COMPLAINT: Fever, sore throat, cough  HPI: Patient is a 36 year old male with previous history of asthma who presents to the emergency department with fever of 100.5 that started last night, sore throat, dry nasal passages.  He also reports a cough that he has had for the past 3 weeks.  It is nonproductive.  Was seen here on November 11 and had a negative chest x-ray.  Was discharged with prednisone and Tessalon Perles.  Reports no significant change in his cough.  States his child was recently diagnosed with strep throat.  No other known sick contacts.  Has not yet had an influenza vaccination this year.  No nausea, vomiting or diarrhea.  Last took ibuprofen at 7 PM last night.  States he has been using nasal saline, ibuprofen without much relief.  He reports also having body aches and fatigue.  ROS: See HPI Constitutional:  fever  Eyes: no drainage  ENT: no runny nose   Cardiovascular:  no chest pain  Resp: no SOB  GI: no vomiting GU: no dysuria Integumentary: no rash  Allergy: no hives  Musculoskeletal: no leg swelling  Neurological: no slurred speech ROS otherwise negative  PAST MEDICAL HISTORY/PAST SURGICAL HISTORY:  Past Medical History:  Diagnosis Date  . Anxiety   . Asthma   . GERD (gastroesophageal reflux disease)   . MRSA (methicillin resistant staph aureus) culture positive     MEDICATIONS:  Prior to Admission medications   Medication Sig Start Date End Date Taking? Authorizing Provider  benzonatate (TESSALON) 100 MG capsule Take 1 capsule (100 mg total) every 8 (eight) hours by mouth. 02/03/17   Eber HongMiller, Brian, MD  ibuprofen (ADVIL,MOTRIN) 800 MG tablet Take 1 tablet (800 mg total) by mouth 3 (three) times daily. 12/22/14   Mady GemmaWestfall, Elizabeth C, [name]  predniSONE (DELTASONE) 20 MG tablet Take 2 tablets (40 mg total) daily by mouth. 02/03/17   Eber HongMiller, Brian, MD    ALLERGIES:  Allergies  Allergen Reactions  . Bactrim Rash  . Cephalexin Rash   . Vicodin [Hydrocodone-Acetaminophen] Palpitations    SOCIAL HISTORY:  Social History   Tobacco Use  . Smoking status: Former Games developermoker  . Smokeless tobacco: Never Used  Substance Use Topics  . Alcohol use: No    FAMILY HISTORY: Family History  Problem Relation Age of Onset  . Heart failure Mother   . Heart failure Father     EXAM: BP 115/83 (BP Location: Right Arm)   Pulse 75   Temp 98 F (36.7 C) (Oral)   Resp 16   Ht 6\' 3"  (1.905 m)   Wt 106.1 kg (234 lb)   SpO2 99%   BMI 29.25 kg/m  CONSTITUTIONAL: Alert and oriented and responds appropriately to questions. Well-appearing; well-nourished HEAD: Normocephalic EYES: Conjunctivae clear, pupils appear equal, EOMI ENT: normal nose; moist mucous membranes; petechiae, mild bilateral tonsillar hypertrophy without exudate, no uvular deviation, no unilateral swelling, no trismus or drooling, no muffled voice, normal phonation, no stridor, no dental caries present, no drainable dental abscess noted, no Ludwig's angina, tongue sits flat in the bottom of the mouth, no angioedema, no facial erythema or warmth, no facial swelling; no pain with movement of the neck. NECK: Supple, no meningismus, no nuchal rigidity, no LAD  CARD: RRR; S1 and S2 appreciated; no murmurs, no clicks, no rubs, no gallops RESP: Normal chest excursion without splinting or tachypnea; breath sounds clear and equal bilaterally; no wheezes, no rhonchi, no rales, no hypoxia or  respiratory distress, speaking full sentences ABD/GI: Normal bowel sounds; non-distended; soft, non-tender, no rebound, no guarding, no peritoneal signs, no hepatosplenomegaly BACK:  The back appears normal and is non-tender to palpation, there is no CVA tenderness EXT: Normal ROM in all joints; non-tender to palpation; no edema; normal capillary refill; no cyanosis, no calf tenderness or swelling    SKIN: Normal color for age and race; warm; no rash NEURO: Moves all extremities equally PSYCH:  The patient's mood and manner are appropriate. Grooming and personal hygiene are appropriate.  MEDICAL DECISION MAKING: Patient here with flulike symptoms.  He is concerned he needs antibiotics.  Will obtain strep test and repeat a chest x-ray given persistent cough.  Discussed with him at length that this likely is a viral illness given he has multiple different symptoms but we will investigate further for any signs of bacterial infection and if he does have a bacterial infection he will be started on antibiotics.  As discussed with him otherwise that this is likely a virus and it does not need antibiotics to treat.  Discussed with him that we do not do routine testing for the flu from the emergency department.  We have discussed risk and benefits of Tamiflu.  He declines any medications for symptomatic relief in the emergency department at this time.  ED PROGRESS: Patient strep test is negative and chest x-ray is clear.  I suspect this is a viral illness and could be the flu.  I have provided him with a prescription for Tamiflu but we have discussed risk and benefits of this medication.  We have discussed side effects.  Will discharge with prescription of Zofran as well.  Given supportive care instructions on medications to take at home to help with symptomatic relief.  Discussed return precautions.  Provided work note.  I do not feel he needs antibiotics at this time.   At this time, I do not feel there is any life-threatening condition present. I have reviewed and discussed all results (EKG, imaging, lab, urine as appropriate) and exam findings with patient/family. I have reviewed nursing notes and appropriate previous records.  I feel the patient is safe to be discharged home without further emergent workup and can continue workup as an outpatient as needed. Discussed usual and customary return precautions. Patient/family verbalize understanding and are comfortable with this plan.  Outpatient follow-up  has been provided if needed. All questions have been answered.      Bettyanne Dittman, Layla MawKristen N, DO 02/22/17 [phone]

## 2017-02-22 NOTE — Discharge Instructions (Signed)
You may alternate Tylenol 1000 mg every 6 hours as needed for fever and pain and ibuprofen 800 mg every 8 hours as needed for fever and pain. Please rest and drink plenty of fluids. This is a viral illness causing your symptoms. You do not need antibiotics for a virus. You may use over-the-counter nasal saline spray and Afrin nasal saline spray as needed for nasal congestion. Please do not use Afrin for more than 3 days in a row. You may use Mucinex and Dextromethorphan as needed for cough.  You may use lozenges and Chloraseptic spray to help with sore throat.  Warm salt water gargles can also help with sore throat.  You may use over-the-counter Unisom (doxyalamine) or Benadryl to help with sleep.  Please note that some combination medicines such as DayQuil and NyQuil have multiple medications in them.  Please make sure you look at all labels to ensure that you are not taking too much of any one particular medication.  Symptoms from a virus may take 7-14 days to run its course.  We do not test for the flu from the emergency department as we do not have rapid flu swabs and it takes hours for this test to come back and it would not change our management. The flu is treated like any other virus with supportive measures as listed above. Tamiflu has to be taken within the first 48 hours of symptoms.  This medication has only been shown to shorten the course of the flu by 1 day.  It does come with multiple side effects including nausea, vomiting and diarrhea.

## 2017-02-22 NOTE — ED Triage Notes (Signed)
C/o sore throat onset yesterday am, w temp 100.5, still has cough from 2 weeks ago, w chest congestion

## 2017-02-24 LAB — CULTURE, GROUP A STREP (THRC)

## 2017-11-03 ENCOUNTER — Emergency Department (HOSPITAL_BASED_OUTPATIENT_CLINIC_OR_DEPARTMENT_OTHER): Payer: No Typology Code available for payment source

## 2017-11-03 ENCOUNTER — Other Ambulatory Visit: Payer: Self-pay

## 2017-11-03 ENCOUNTER — Encounter (HOSPITAL_BASED_OUTPATIENT_CLINIC_OR_DEPARTMENT_OTHER): Payer: Self-pay | Admitting: Emergency Medicine

## 2017-11-03 ENCOUNTER — Emergency Department (HOSPITAL_BASED_OUTPATIENT_CLINIC_OR_DEPARTMENT_OTHER)
Admission: EM | Admit: 2017-11-03 | Discharge: 2017-11-03 | Disposition: A | Discharge status: Home / Self Care | Payer: No Typology Code available for payment source | Attending: Emergency Medicine | Admitting: Emergency Medicine

## 2017-11-03 DIAGNOSIS — Z87891 Personal history of nicotine dependence: Secondary | ICD-10-CM | POA: Insufficient documentation

## 2017-11-03 DIAGNOSIS — F419 Anxiety disorder, unspecified: Secondary | ICD-10-CM | POA: Insufficient documentation

## 2017-11-03 DIAGNOSIS — Z9049 Acquired absence of other specified parts of digestive tract: Secondary | ICD-10-CM | POA: Diagnosis not present

## 2017-11-03 DIAGNOSIS — K5900 Constipation, unspecified: Secondary | ICD-10-CM | POA: Diagnosis not present

## 2017-11-03 DIAGNOSIS — Z79899 Other long term (current) drug therapy: Secondary | ICD-10-CM | POA: Insufficient documentation

## 2017-11-03 DIAGNOSIS — J45909 Unspecified asthma, uncomplicated: Secondary | ICD-10-CM | POA: Insufficient documentation

## 2017-11-03 DIAGNOSIS — R1030 Lower abdominal pain, unspecified: Secondary | ICD-10-CM

## 2017-11-03 DIAGNOSIS — R109 Unspecified abdominal pain: Secondary | ICD-10-CM | POA: Diagnosis present

## 2017-11-03 LAB — URINALYSIS, ROUTINE W REFLEX MICROSCOPIC
BILIRUBIN URINE: NEGATIVE
Glucose, UA: NEGATIVE mg/dL
Hgb urine dipstick: NEGATIVE
KETONES UR: NEGATIVE mg/dL
LEUKOCYTES UA: NEGATIVE
NITRITE: NEGATIVE
PROTEIN: NEGATIVE mg/dL
Specific Gravity, Urine: 1.015 (ref 1.005–1.030)
pH: 6.5 (ref 5.0–8.0)

## 2017-11-03 LAB — CBC WITH DIFFERENTIAL/PLATELET
BASOS PCT: 0 %
Basophils Absolute: 0 10*3/uL (ref 0.0–0.1)
EOS ABS: 0.2 10*3/uL (ref 0.0–0.7)
EOS PCT: 3 %
HCT: 43.1 % (ref 39.0–52.0)
Hemoglobin: 14.4 g/dL (ref 13.0–17.0)
Lymphocytes Relative: 37 %
Lymphs Abs: 2.5 10*3/uL (ref 0.7–4.0)
MCH: 27.4 pg (ref 26.0–34.0)
MCHC: 33.4 g/dL (ref 30.0–36.0)
MCV: 82.1 fL (ref 78.0–100.0)
Monocytes Absolute: 0.4 10*3/uL (ref 0.1–1.0)
Monocytes Relative: 6 %
NEUTROS ABS: 3.7 10*3/uL (ref 1.7–7.7)
Neutrophils Relative %: 54 %
PLATELETS: 181 10*3/uL (ref 150–400)
RBC: 5.25 MIL/uL (ref 4.22–5.81)
RDW: 14.3 % (ref 11.5–15.5)
WBC: 6.8 10*3/uL (ref 4.0–10.5)

## 2017-11-03 LAB — COMPREHENSIVE METABOLIC PANEL
ALT: 26 U/L (ref 0–44)
AST: 26 U/L (ref 15–41)
Albumin: 4.6 g/dL (ref 3.5–5.0)
Alkaline Phosphatase: 44 U/L (ref 38–126)
Anion gap: 6 (ref 5–15)
BILIRUBIN TOTAL: 0.8 mg/dL (ref 0.3–1.2)
BUN: 10 mg/dL (ref 6–20)
CALCIUM: 9.1 mg/dL (ref 8.9–10.3)
CO2: 32 mmol/L (ref 22–32)
CREATININE: 1.21 mg/dL (ref 0.61–1.24)
Chloride: 103 mmol/L (ref 98–111)
GFR calc Af Amer: >60 mL/min (ref >60)
Glucose, Bld: 93 mg/dL (ref 70–99)
POTASSIUM: 3.6 mmol/L (ref 3.5–5.1)
Sodium: 141 mmol/L (ref 135–145)
TOTAL PROTEIN: 8.1 g/dL (ref 6.5–8.1)

## 2017-11-03 LAB — LIPASE, BLOOD: LIPASE: 39 U/L (ref 11–51)

## 2017-11-03 MED ORDER — IOPAMIDOL (ISOVUE-300) INJECTION 61%
100.0000 mL | Freq: Once | INTRAVENOUS | Status: AC | PRN
  Administered 2017-11-03: 100 mL via INTRAVENOUS

## 2017-11-03 MED ORDER — ONDANSETRON HCL 4 MG/2ML IJ SOLN
4.0000 mg | Freq: Once | INTRAMUSCULAR | Status: AC
  Administered 2017-11-03: 4 mg via INTRAVENOUS
  Filled 2017-11-03: qty 2

## 2017-11-03 MED ORDER — SODIUM CHLORIDE 0.9 % IV BOLUS
1000.0000 mL | Freq: Once | INTRAVENOUS | Status: AC
  Administered 2017-11-03: 1000 mL via INTRAVENOUS

## 2017-11-03 MED ORDER — HYDROMORPHONE HCL 1 MG/ML IJ SOLN
1.0000 mg | Freq: Once | INTRAMUSCULAR | Status: AC
  Administered 2017-11-03: 1 mg via INTRAVENOUS
  Filled 2017-11-03: qty 1

## 2017-11-03 NOTE — ED Triage Notes (Signed)
Patient states that he started to have generalize abdominal pains since THursday  - the patient reports that he has colitis and he feels like it may have flared up  - Patient reports that he has had a decrease in his BM's and he has had N/V today

## 2017-11-03 NOTE — Discharge Instructions (Addendum)
If your abdominal pain worsens or you develop vomiting, fever, or any other new/concerning symptoms or return to the ER for evaluation.

## 2017-11-03 NOTE — ED Provider Notes (Signed)
[facility] EMERGENCY DEPARTMENT Provider Note   CSN: [id number] Arrival date & time: 11/03/17  1809     History   Chief Complaint Chief Complaint  Patient presents with  . Abdominal Pain    HPI Bryan Gill is a 37 y.o. male.  HPI  37 year old male with a history of colitis that he states his ulcerative colitis presents with abdominal pain.  He states that started 2 days ago.  Progressively worsening.  He is been constipated for about a week and start taking a stool softener.  This is resulted in having small stools but not a normal bowel movement.  Similar to prior colitis.  Now he started to have nausea and vomiting today.  No fevers but has had some chills.  Pain is rated as an 8/10.  Also having back pain as well in a similar area.  Feels an urgency to go the bathroom to have a bowel movement but then nothing comes out.  No urinary symptoms. Feels like a cramping/bloating pain.  Past Medical History:  Diagnosis Date  . Anxiety   . Asthma   . GERD (gastroesophageal reflux disease)   . MRSA (methicillin resistant staph aureus) culture positive     There are no active problems to display for this patient.   Past Surgical History:  Procedure Laterality Date  . CHOLECYSTECTOMY    . HERNIA REPAIR          Home Medications    Prior to Admission medications   Medication Sig Start Date End Date Taking? Authorizing Provider  benzonatate (TESSALON) 100 MG capsule Take 1 capsule (100 mg total) every 8 (eight) hours by mouth. 02/03/17   Eber Hong, MD  ibuprofen (ADVIL,MOTRIN) 800 MG tablet Take 1 tablet (800 mg total) by mouth 3 (three) times daily. 12/22/14   [name], [name]  ondansetron (ZOFRAN ODT) 4 MG disintegrating tablet Take 1 tablet (4 mg total) by mouth every 8 (eight) hours as needed for nausea or vomiting. 02/22/17   Ward, Layla Maw, DO  oseltamivir (TAMIFLU) 75 MG capsule Take 1 capsule (75 mg total) by mouth every 12 (twelve) hours.  02/22/17   Ward, Layla Maw, DO  predniSONE (DELTASONE) 20 MG tablet Take 2 tablets (40 mg total) daily by mouth. 02/03/17   Eber Hong, MD    Family History Family History  Problem Relation Age of Onset  . Heart failure Mother   . Heart failure Father     Social History Social History   Tobacco Use  . Smoking status: Former Games developer  . Smokeless tobacco: Never Used  Substance Use Topics  . Alcohol use: No  . Drug use: Yes    Types: Marijuana     Allergies   Bactrim; Cephalexin; and Vicodin [hydrocodone-acetaminophen]   Review of Systems Review of Systems  Constitutional: Positive for chills. Negative for fever.  Gastrointestinal: Positive for abdominal pain, constipation, nausea and vomiting. Negative for blood in stool and diarrhea.  Genitourinary: Negative for dysuria.  Musculoskeletal: Positive for back pain.  All other systems reviewed and are negative.    Physical Exam Updated Vital Signs BP 112/82   Pulse 82   Temp 97.9 F (36.6 C) (Oral)   Resp 16   Ht 6\' 3"  (1.905 m)   Wt 106.6 kg   SpO2 99%   BMI 29.37 kg/m   Physical Exam  Constitutional: He is oriented to person, place, and time. He appears well-developed and well-nourished.  Non-toxic appearance. He does not  appear ill. No distress.  HENT:  Head: Normocephalic and atraumatic.  Right Ear: External ear normal.  Left Ear: External ear normal.  Nose: Nose normal.  Eyes: Right eye exhibits no discharge. Left eye exhibits no discharge.  Neck: Neck supple.  Cardiovascular: Normal rate, regular rhythm and normal heart sounds.  Pulmonary/Chest: Effort normal and breath sounds normal.  Abdominal: Soft. There is tenderness in the right lower quadrant and left lower quadrant.  Musculoskeletal: He exhibits no edema.  Neurological: He is alert and oriented to person, place, and time.  Skin: Skin is warm and dry.  Nursing note and vitals reviewed.    ED Treatments / Results  Labs (all labs ordered  are listed, but only abnormal results are displayed) Labs Reviewed  COMPREHENSIVE METABOLIC PANEL  LIPASE, BLOOD  CBC WITH DIFFERENTIAL/PLATELET  URINALYSIS, ROUTINE W REFLEX MICROSCOPIC    EKG None  Radiology Ct Abdomen Pelvis W Contrast  Result Date: 11/03/2017 CLINICAL DATA:  Abdominal pain for several days, initial encounter EXAM: CT ABDOMEN AND PELVIS WITH CONTRAST TECHNIQUE: Multidetector CT imaging of the abdomen and pelvis was performed using the standard protocol following bolus administration of intravenous contrast. CONTRAST:  100mL ISOVUE-300 IOPAMIDOL (ISOVUE-300) INJECTION 61% COMPARISON:  06/07/2015 FINDINGS: Lower chest: Lung bases are free of acute infiltrate or sizable effusion. Changes of reflux are noted with fluid in the distal esophagus. Hepatobiliary: No focal liver abnormality is seen. Status post cholecystectomy. No biliary dilatation. Pancreas: Unremarkable. No pancreatic ductal dilatation or surrounding inflammatory changes. Spleen: Normal in size without focal abnormality. Adrenals/Urinary Tract: Adrenal glands are within normal limits. Kidneys are well visualized bilaterally. No renal calculi or obstructive changes are seen. Bladder is decompressed. Stomach/Bowel: Stomach is within normal limits. Appendix appears normal. No evidence of bowel wall thickening, distention, or inflammatory changes. Vascular/Lymphatic: No significant vascular findings are present. No enlarged abdominal or pelvic lymph nodes. Reproductive: Prostate is unremarkable. Other: Small umbilical hernia is noted with a small bowel loop within. The previously seen inflammatory changes have resolved in the interval. Musculoskeletal: No acute or significant osseous findings. IMPRESSION: Small umbilical hernia as described. Loop of small bowel is noted within although the inflammatory changes seen previously have resolved in the interval. Changes of reflux with fluid in the distal esophagus. No other focal  abnormality is noted. Electronically Signed   By: Alcide CleverMark  Lukens M.D.   On: 11/03/2017 21:36    Procedures Procedures (including critical care time)  Medications Ordered in ED Medications  sodium chloride 0.9 % bolus 1,000 mL (0 mLs Intravenous Stopped 11/03/17 2049)  ondansetron (ZOFRAN) injection 4 mg (4 mg Intravenous Given 11/03/17 2043)  HYDROmorphone (DILAUDID) injection 1 mg (1 mg Intravenous Given 11/03/17 2044)  iopamidol (ISOVUE-300) 61 % injection 100 mL (100 mLs Intravenous Contrast Given 11/03/17 2053)     Initial Impression / Assessment and Plan / ED Course  I have reviewed the triage vital signs and the nursing notes.  Pertinent labs & imaging results that were available during my care of the patient were reviewed by me and considered in my medical decision making (see chart for details).     Patient's lab work is reassuring and his CT scan is unremarkable.  CT obtained given his reported history of colitis and lower abdominal pain. The patient is comfortable appearing and has benign vital signs.  Probably this is related to constipation more than anything else.  Discussed using MiraLAX in addition to the stool softener but he appears stable for discharge home to  follow-up with a PCP.  Final Clinical Impressions(s) / ED Diagnoses   Final diagnoses:  Lower abdominal pain  Constipation, unspecified constipation type    ED Discharge Orders    None       Pricilla Loveless, MD 11/03/17 2329

## 2017-11-03 NOTE — ED Notes (Signed)
Alert, NAD, calm, interactive, resps e/u, speaking in clear complete sentences, no dyspnea noted, skin W&D, VSS, c/o abd pain, NV, and constipation, relates sx to colitis flare up, onset Thursday, (denies: fever, bleeding, diarrhea, or dizziness or visual changes).

## 2018-02-02 ENCOUNTER — Encounter (HOSPITAL_BASED_OUTPATIENT_CLINIC_OR_DEPARTMENT_OTHER): Payer: Self-pay | Admitting: Emergency Medicine

## 2018-02-02 ENCOUNTER — Emergency Department (HOSPITAL_BASED_OUTPATIENT_CLINIC_OR_DEPARTMENT_OTHER)
Admission: EM | Admit: 2018-02-02 | Discharge: 2018-02-02 | Disposition: A | Discharge status: Home / Self Care | Payer: No Typology Code available for payment source | Attending: Emergency Medicine | Admitting: Emergency Medicine

## 2018-02-02 ENCOUNTER — Emergency Department (HOSPITAL_BASED_OUTPATIENT_CLINIC_OR_DEPARTMENT_OTHER): Payer: No Typology Code available for payment source

## 2018-02-02 ENCOUNTER — Other Ambulatory Visit: Payer: Self-pay

## 2018-02-02 DIAGNOSIS — F1210 Cannabis abuse, uncomplicated: Secondary | ICD-10-CM | POA: Diagnosis not present

## 2018-02-02 DIAGNOSIS — R51 Headache: Secondary | ICD-10-CM | POA: Insufficient documentation

## 2018-02-02 DIAGNOSIS — R519 Headache, unspecified: Secondary | ICD-10-CM

## 2018-02-02 DIAGNOSIS — J45909 Unspecified asthma, uncomplicated: Secondary | ICD-10-CM | POA: Insufficient documentation

## 2018-02-02 LAB — CBC WITH DIFFERENTIAL/PLATELET
ABS IMMATURE GRANULOCYTES: 0.01 10*3/uL (ref 0.00–0.07)
BASOS ABS: 0 10*3/uL (ref 0.0–0.1)
Basophils Relative: 1 %
EOS PCT: 3 %
Eosinophils Absolute: 0.1 10*3/uL (ref 0.0–0.5)
HCT: 45.6 % (ref 39.0–52.0)
HEMOGLOBIN: 14.6 g/dL (ref 13.0–17.0)
Immature Granulocytes: 0 %
LYMPHS ABS: 1.9 10*3/uL (ref 0.7–4.0)
LYMPHS PCT: 38 %
MCH: 26.8 pg (ref 26.0–34.0)
MCHC: 32 g/dL (ref 30.0–36.0)
MCV: 83.7 fL (ref 80.0–100.0)
Monocytes Absolute: 0.4 10*3/uL (ref 0.1–1.0)
Monocytes Relative: 8 %
NEUTROS ABS: 2.6 10*3/uL (ref 1.7–7.7)
NRBC: 0 % (ref 0.0–0.2)
Neutrophils Relative %: 50 %
Platelets: 172 10*3/uL (ref 150–400)
RBC: 5.45 MIL/uL (ref 4.22–5.81)
RDW: 13.8 % (ref 11.5–15.5)
WBC: 5.1 10*3/uL (ref 4.0–10.5)

## 2018-02-02 LAB — BASIC METABOLIC PANEL
Anion gap: 8 (ref 5–15)
BUN: 15 mg/dL (ref 6–20)
CHLORIDE: 103 mmol/L (ref 98–111)
CO2: 27 mmol/L (ref 22–32)
CREATININE: 0.99 mg/dL (ref 0.61–1.24)
Calcium: 9.1 mg/dL (ref 8.9–10.3)
GFR calc non Af Amer: >60 mL/min (ref >60)
Glucose, Bld: 109 mg/dL — ABNORMAL HIGH (ref 70–99)
Potassium: 3.9 mmol/L (ref 3.5–5.1)
Sodium: 138 mmol/L (ref 135–145)

## 2018-02-02 MED ORDER — DIPHENHYDRAMINE HCL 50 MG/ML IJ SOLN
25.0000 mg | Freq: Once | INTRAMUSCULAR | Status: AC
  Administered 2018-02-02: 25 mg via INTRAVENOUS
  Filled 2018-02-02: qty 1

## 2018-02-02 MED ORDER — METOCLOPRAMIDE HCL 5 MG/ML IJ SOLN
10.0000 mg | Freq: Once | INTRAMUSCULAR | Status: AC
  Administered 2018-02-02: 10 mg via INTRAVENOUS
  Filled 2018-02-02: qty 2

## 2018-02-02 MED ORDER — KETOROLAC TROMETHAMINE 15 MG/ML IJ SOLN
15.0000 mg | Freq: Once | INTRAMUSCULAR | Status: AC
Start: 2018-02-02 — End: 2018-02-02
  Administered 2018-02-02: 15 mg via INTRAVENOUS
  Filled 2018-02-02: qty 1

## 2018-02-02 MED ORDER — SODIUM CHLORIDE 0.9 % IV BOLUS
1000.0000 mL | Freq: Once | INTRAVENOUS | Status: AC
  Administered 2018-02-02: 1000 mL via INTRAVENOUS

## 2018-02-02 NOTE — ED Provider Notes (Signed)
[facility] EMERGENCY DEPARTMENT Provider Note   CSN: [id number] Arrival date & time: 02/02/18  0715     History   Chief Complaint Chief Complaint  Patient presents with  . Headache    HPI Bryan Gill is a 37 y.o. male.  HPI  37 year old male presents with headache for about 2 weeks.  It has been constant and progressively worsening.  Started off as a mild headache but now is about a 7 out of 10.  It feels like his bilateral temples are being squeezed.  There is also some maxillary sinus pressure but no sinus drainage.  He had a fever of 100.4 about 3 days ago associated with some nausea and vomiting for 2 days but since has not had a fever.  He states that he does not have blurry vision but things seem to be oscillating back-and-forth when he stares at them.  No specific dizziness.  No focal weakness.  No cough or other infectious symptoms.  He has tried ibuprofen and Tylenol with no relief.  Past Medical History:  Diagnosis Date  . Anxiety   . Asthma   . GERD (gastroesophageal reflux disease)   . MRSA (methicillin resistant staph aureus) culture positive     There are no active problems to display for this patient.   Past Surgical History:  Procedure Laterality Date  . CHOLECYSTECTOMY    . HERNIA REPAIR          Home Medications    Prior to Admission medications   Medication Sig Start Date End Date Taking? Authorizing Provider  benzonatate (TESSALON) 100 MG capsule Take 1 capsule (100 mg total) every 8 (eight) hours by mouth. 02/03/17   Eber Hong, MD  ibuprofen (ADVIL,MOTRIN) 800 MG tablet Take 1 tablet (800 mg total) by mouth 3 (three) times daily. 12/22/14   [name], [name]  ondansetron (ZOFRAN ODT) 4 MG disintegrating tablet Take 1 tablet (4 mg total) by mouth every 8 (eight) hours as needed for nausea or vomiting. 02/22/17   Ward, Layla Maw, DO  oseltamivir (TAMIFLU) 75 MG capsule Take 1 capsule (75 mg total) by mouth every 12  (twelve) hours. 02/22/17   Ward, Layla Maw, DO  predniSONE (DELTASONE) 20 MG tablet Take 2 tablets (40 mg total) daily by mouth. 02/03/17   Eber Hong, MD    Family History Family History  Problem Relation Age of Onset  . Heart failure Mother   . Heart failure Father     Social History Social History   Tobacco Use  . Smoking status: Former Games developer  . Smokeless tobacco: Never Used  Substance Use Topics  . Alcohol use: No  . Drug use: Yes    Types: Marijuana     Allergies   Bactrim; Cephalexin; and Vicodin [hydrocodone-acetaminophen]   Review of Systems Review of Systems  Constitutional: Positive for fever (3 days ago, none since).  HENT: Positive for sinus pressure. Negative for congestion and rhinorrhea.   Eyes: Positive for photophobia and visual disturbance.  Respiratory: Negative for cough and shortness of breath.   Cardiovascular: Negative for chest pain and leg swelling.  Gastrointestinal: Negative for abdominal pain and vomiting.  Neurological: Positive for headaches. Negative for weakness.  All other systems reviewed and are negative.    Physical Exam Updated Vital Signs BP 129/75 (BP Location: Right Arm)   Pulse 66   Temp 97.9 F (36.6 C) (Oral)   Resp 16   Ht 6\' 3"  (1.905 m)   Wt  108.9 kg   SpO2 97%   BMI 30.00 kg/m   Physical Exam  Constitutional: He is oriented to person, place, and time. He appears well-developed and well-nourished.  Non-toxic appearance. He does not appear ill. No distress.  HENT:  Head: Normocephalic and atraumatic.  Right Ear: External ear normal.  Left Ear: External ear normal.  Nose: Nose normal.  Mild bilateral temporal tenderness. Mild tenderness over bilateral maxillary sinuses  Eyes: Pupils are equal, round, and reactive to light. EOM are normal. Right eye exhibits no discharge. Left eye exhibits no discharge.  Neck: Normal range of motion. Neck supple. No neck rigidity.  Cardiovascular: Normal rate, regular  rhythm and normal heart sounds.  Pulmonary/Chest: Effort normal and breath sounds normal.  Abdominal: Soft. There is no tenderness.  Musculoskeletal: He exhibits no edema.  Neurological: He is alert and oriented to person, place, and time.  CN 3-12 grossly intact. 5/5 strength in all 4 extremities. Grossly normal sensation. Normal finger to nose.   Skin: Skin is warm and dry.  Psychiatric: His mood appears not anxious.  Nursing note and vitals reviewed.    ED Treatments / Results  Labs (all labs ordered are listed, but only abnormal results are displayed) Labs Reviewed  BASIC METABOLIC PANEL - Abnormal; Notable for the following components:      Result Value   Glucose, Bld 109 (*)    All other components within normal limits  CBC WITH DIFFERENTIAL/PLATELET    EKG None  Radiology Ct Head Wo Contrast  Result Date: 02/02/2018 CLINICAL DATA:  Headache for 2 weeks with intermittent nausea vomiting and blurred vision. EXAM: CT HEAD WITHOUT CONTRAST TECHNIQUE: Contiguous axial images were obtained from the base of the skull through the vertex without intravenous contrast. COMPARISON:  05/13/2014 FINDINGS: Brain: No evidence of acute infarction, hemorrhage, hydrocephalus, extra-axial collection or mass lesion/mass effect. Vascular: No hyperdense vessel or unexpected calcification. Skull: Normal. Negative for fracture or focal lesion. Sinuses/Orbits: Normal globes and orbits. Visualized sinuses and mastoid air cells are clear. Other: None. IMPRESSION: Normal unenhanced CT scan of the brain. Electronically Signed   By: Amie Portland M.D.   On: 02/02/2018 08:19    Procedures Procedures (including critical care time)  Medications Ordered in ED Medications  sodium chloride 0.9 % bolus 1,000 mL (0 mLs Intravenous Stopped 02/02/18 0907)  metoCLOPramide (REGLAN) injection 10 mg (10 mg Intravenous Given 02/02/18 0804)  diphenhydrAMINE (BENADRYL) injection 25 mg (25 mg Intravenous Given  02/02/18 0805)  ketorolac (TORADOL) 15 MG/ML injection 15 mg (15 mg Intravenous Given 02/02/18 0840)     Initial Impression / Assessment and Plan / ED Course  I have reviewed the triage vital signs and the nursing notes.  Pertinent labs & imaging results that were available during my care of the patient were reviewed by me and considered in my medical decision making (see chart for details).     Patient's headache has significantly improved with fluids and IV treatment and is now mild.  Given the length of time of his symptoms CT obtained.  He is feeling much better.  The low-grade fever is of unclear etiology and only occurred on one day.  His presentation is not consistent with meningitis and my suspicion for encephalitis is pretty low.  I doubt subarachnoid hemorrhage.  On reevaluation, he states that he has had headaches periodically but they never last this long.  However he would say that they are kind of similar in quality.  Thus I think this  is probably more of an acute on chronic headache.  As far as the blurry vision/oscillating vision, he states that has improved but not gone away.  When discussing this, I recommended that an MRI may be beneficial given still having symptoms.  However he declines this.  He understands we cannot rule out further CNS pathology without it.  I think it is probably all related to the generic headache rather than an acute CNS emergency but still recommend the MRI.  He still declines and wants to go home.  We discussed strict return precautions and I will refer him to neurology.  Final Clinical Impressions(s) / ED Diagnoses   Final diagnoses:  Temporal headache    ED Discharge Orders         Ordered    Ambulatory referral to Neurology    Comments:  An appointment is requested in approximately: 2 weeks   02/02/18 1610           Pricilla Loveless, MD 02/02/18 [phone]

## 2018-02-02 NOTE — Discharge Instructions (Addendum)
If you develop worsening headache, severe headache, neck stiffness, fever, weakness or numbness in your arms or legs, blurry vision or any other new/concerning symptoms and return to the ER for evaluation.

## 2018-02-02 NOTE — ED Triage Notes (Signed)
Frontal headache x 2 weeks with intermittent vomiting. States he had a fever on Thursday. Describes as squeezing.

## 2018-02-02 NOTE — ED Notes (Signed)
ED Provider at bedside. 

## 2018-02-02 NOTE — ED Notes (Signed)
Patient transported to CT 

## 2018-05-28 ENCOUNTER — Emergency Department (HOSPITAL_BASED_OUTPATIENT_CLINIC_OR_DEPARTMENT_OTHER): Payer: No Typology Code available for payment source

## 2018-05-28 ENCOUNTER — Other Ambulatory Visit: Payer: Self-pay

## 2018-05-28 ENCOUNTER — Emergency Department (HOSPITAL_BASED_OUTPATIENT_CLINIC_OR_DEPARTMENT_OTHER)
Admission: EM | Admit: 2018-05-28 | Discharge: 2018-05-29 | Disposition: A | Discharge status: Home / Self Care | Payer: No Typology Code available for payment source | Attending: Emergency Medicine | Admitting: Emergency Medicine

## 2018-05-28 ENCOUNTER — Encounter (HOSPITAL_BASED_OUTPATIENT_CLINIC_OR_DEPARTMENT_OTHER): Payer: Self-pay

## 2018-05-28 DIAGNOSIS — Z87891 Personal history of nicotine dependence: Secondary | ICD-10-CM | POA: Diagnosis not present

## 2018-05-28 DIAGNOSIS — K529 Noninfective gastroenteritis and colitis, unspecified: Secondary | ICD-10-CM

## 2018-05-28 DIAGNOSIS — F419 Anxiety disorder, unspecified: Secondary | ICD-10-CM | POA: Diagnosis not present

## 2018-05-28 DIAGNOSIS — Z9049 Acquired absence of other specified parts of digestive tract: Secondary | ICD-10-CM | POA: Insufficient documentation

## 2018-05-28 DIAGNOSIS — J45909 Unspecified asthma, uncomplicated: Secondary | ICD-10-CM | POA: Insufficient documentation

## 2018-05-28 DIAGNOSIS — Z79899 Other long term (current) drug therapy: Secondary | ICD-10-CM | POA: Diagnosis not present

## 2018-05-28 DIAGNOSIS — R197 Diarrhea, unspecified: Secondary | ICD-10-CM | POA: Diagnosis present

## 2018-05-28 LAB — COMPREHENSIVE METABOLIC PANEL
ALBUMIN: 4.5 g/dL (ref 3.5–5.0)
ALT: 41 U/L (ref 0–44)
AST: 30 U/L (ref 15–41)
Alkaline Phosphatase: 45 U/L (ref 38–126)
Anion gap: 7 (ref 5–15)
BUN: 14 mg/dL (ref 6–20)
CO2: 26 mmol/L (ref 22–32)
Calcium: 9.3 mg/dL (ref 8.9–10.3)
Chloride: 103 mmol/L (ref 98–111)
Creatinine, Ser: 1.17 mg/dL (ref 0.61–1.24)
GFR calc Af Amer: >60 mL/min (ref >60)
GFR calc non Af Amer: >60 mL/min (ref >60)
GLUCOSE: 107 mg/dL — AB (ref 70–99)
Potassium: 3.4 mmol/L — ABNORMAL LOW (ref 3.5–5.1)
Sodium: 136 mmol/L (ref 135–145)
Total Bilirubin: 0.7 mg/dL (ref 0.3–1.2)
Total Protein: 7.8 g/dL (ref 6.5–8.1)

## 2018-05-28 LAB — CBC
HEMATOCRIT: 45.5 % (ref 39.0–52.0)
Hemoglobin: 14.3 g/dL (ref 13.0–17.0)
MCH: 26.7 pg (ref 26.0–34.0)
MCHC: 31.4 g/dL (ref 30.0–36.0)
MCV: 85 fL (ref 80.0–100.0)
Platelets: 191 10*3/uL (ref 150–400)
RBC: 5.35 MIL/uL (ref 4.22–5.81)
RDW: 13.9 % (ref 11.5–15.5)
WBC: 6.4 10*3/uL (ref 4.0–10.5)
nRBC: 0 % (ref 0.0–0.2)

## 2018-05-28 LAB — URINALYSIS, ROUTINE W REFLEX MICROSCOPIC
Bilirubin Urine: NEGATIVE
Glucose, UA: NEGATIVE mg/dL
Hgb urine dipstick: NEGATIVE
KETONES UR: NEGATIVE mg/dL
LEUKOCYTE UA: NEGATIVE
Nitrite: NEGATIVE
Protein, ur: NEGATIVE mg/dL
Specific Gravity, Urine: <1.005 — ABNORMAL LOW (ref 1.005–1.030)
pH: 6.5 (ref 5.0–8.0)

## 2018-05-28 LAB — LIPASE, BLOOD: Lipase: 40 U/L (ref 11–51)

## 2018-05-28 MED ORDER — ONDANSETRON HCL 4 MG/2ML IJ SOLN
4.0000 mg | Freq: Once | INTRAMUSCULAR | Status: AC
  Administered 2018-05-28: 4 mg via INTRAVENOUS
  Filled 2018-05-28: qty 2

## 2018-05-28 NOTE — ED Triage Notes (Signed)
Generalized abdominal pain that radiates to epigastric area since yesterday, had diarrhea yesterday and n/v today with fever, tried pepcid, tums and gas-x without relief, has not taken anything for fever

## 2018-05-28 NOTE — ED Provider Notes (Signed)
[facility] EMERGENCY DEPARTMENT Provider Note   CSN: [id number] Arrival date & time: 05/28/18  1854    History   Chief Complaint Chief Complaint  Patient presents with  . Abdominal Pain    HPI Bryan Gill is a 38 y.o. male.     HPI Patient presents to the emergency department with diarrhea with nausea and vomiting.  The patient states he does have some generalized abdominal discomfort.  Patient states nothing seems to make the condition better or worse.  The patient denies chest pain, shortness of breath, headache,blurred vision, neck pain, fever, cough, weakness, numbness, dizziness, anorexia, edema rash, back pain, dysuria, hematemesis, bloody stool, near syncope, or syncope. Past Medical History:  Diagnosis Date  . Anxiety   . Asthma   . GERD (gastroesophageal reflux disease)   . MRSA (methicillin resistant staph aureus) culture positive     There are no active problems to display for this patient.   Past Surgical History:  Procedure Laterality Date  . CHOLECYSTECTOMY    . HERNIA REPAIR          Home Medications    Prior to Admission medications   Medication Sig Start Date End Date Taking? Authorizing Provider  benzonatate (TESSALON) 100 MG capsule Take 1 capsule (100 mg total) every 8 (eight) hours by mouth. 02/03/17   Eber Hong, MD  ibuprofen (ADVIL,MOTRIN) 800 MG tablet Take 1 tablet (800 mg total) by mouth 3 (three) times daily. 12/22/14   [name], [name]  ondansetron (ZOFRAN ODT) 4 MG disintegrating tablet Take 1 tablet (4 mg total) by mouth every 8 (eight) hours as needed for nausea or vomiting. 02/22/17   Ward, Layla Maw, DO  oseltamivir (TAMIFLU) 75 MG capsule Take 1 capsule (75 mg total) by mouth every 12 (twelve) hours. 02/22/17   Ward, Layla Maw, DO  predniSONE (DELTASONE) 20 MG tablet Take 2 tablets (40 mg total) daily by mouth. 02/03/17   Eber Hong, MD    Family History Family History  Problem Relation Age of Onset   . Heart failure Mother   . Heart failure Father     Social History Social History   Tobacco Use  . Smoking status: Former Games developer  . Smokeless tobacco: Never Used  Substance Use Topics  . Alcohol use: No  . Drug use: Yes    Types: Marijuana     Allergies   Bactrim; Cephalexin; and Vicodin [hydrocodone-acetaminophen]   Review of Systems Review of Systems  All other systems negative except as documented in the HPI. All pertinent positives and negatives as reviewed in the HPI. Physical Exam Updated Vital Signs BP 121/84 (BP Location: Right Arm)   Pulse 74   Temp 98 F (36.7 C) (Oral)   Resp 18   Ht 6\' 3"  (1.905 m)   Wt 113.4 kg   SpO2 97%   BMI 31.25 kg/m   Physical Exam Vitals signs and nursing note reviewed.  Constitutional:      General: He is not in acute distress.    Appearance: He is well-developed.  HENT:     Head: Normocephalic and atraumatic.  Eyes:     Pupils: Pupils are equal, round, and reactive to light.  Neck:     Musculoskeletal: Normal range of motion and neck supple.  Cardiovascular:     Rate and Rhythm: Normal rate and regular rhythm.     Heart sounds: Normal heart sounds. No murmur. No friction rub. No gallop.   Pulmonary:  Effort: Pulmonary effort is normal. No respiratory distress.     Breath sounds: Normal breath sounds. No wheezing.  Abdominal:     General: Bowel sounds are normal. There is no distension.     Palpations: Abdomen is soft.     Tenderness: There is abdominal tenderness.  Skin:    General: Skin is warm and dry.     Capillary Refill: Capillary refill takes less than 2 seconds.     Findings: No erythema or rash.  Neurological:     Mental Status: He is alert and oriented to person, place, and time.     Motor: No abnormal muscle tone.     Coordination: Coordination normal.  Psychiatric:        Behavior: Behavior normal.      ED Treatments / Results  Labs (all labs ordered are listed, but only abnormal results  are displayed) Labs Reviewed  URINALYSIS, ROUTINE W REFLEX MICROSCOPIC - Abnormal; Notable for the following components:      Result Value   Specific Gravity, Urine <1.005 (*)    All other components within normal limits  COMPREHENSIVE METABOLIC PANEL - Abnormal; Notable for the following components:   Potassium 3.4 (*)    Glucose, Bld 107 (*)    All other components within normal limits  LIPASE, BLOOD  CBC    EKG None  Radiology No results found.  Procedures Procedures (including critical care time)  Medications Ordered in ED Medications  ondansetron (ZOFRAN) injection 4 mg (4 mg Intravenous Given 05/28/18 2251)     Initial Impression / Assessment and Plan / ED Course  I have reviewed the triage vital signs and the nursing notes.  Pertinent labs & imaging results that were available during my care of the patient were reviewed by me and considered in my medical decision making (see chart for details).        Patient is awaiting CT scan.  We will give IV fluids and antiemetics .   Final Clinical Impressions(s) / ED Diagnoses   Final diagnoses:  None    ED Discharge Orders    None       Charlestine Night, [name] 05/28/18 2336    Alvira Monday, MD 05/30/18 [phone]

## 2018-05-29 MED ORDER — DICYCLOMINE HCL 20 MG PO TABS: 20.0000 mg | ORAL_TABLET | Freq: Four times a day (QID) | ORAL | 0 refills | Status: DC | PRN

## 2018-05-29 MED ORDER — IOHEXOL 300 MG/ML  SOLN
100.0000 mL | Freq: Once | INTRAMUSCULAR | Status: AC | PRN
  Administered 2018-05-29: 100 mL via INTRAVENOUS

## 2018-05-29 MED ORDER — DICYCLOMINE HCL 10 MG PO CAPS
20.0000 mg | ORAL_CAPSULE | Freq: Once | ORAL | Status: AC
  Administered 2018-05-29: 20 mg via ORAL
  Filled 2018-05-29: qty 2

## 2018-05-29 MED ORDER — ONDANSETRON 8 MG PO TBDP: 8.0000 mg | ORAL_TABLET | Freq: Three times a day (TID) | ORAL | 0 refills | Status: DC | PRN

## 2018-05-29 NOTE — ED Provider Notes (Signed)
Nursing notes and vitals signs, including pulse oximetry, reviewed.  Summary of this visit's results, reviewed by myself:  EKG:  EKG Interpretation  Date/Time:    Ventricular Rate:    PR Interval:    QRS Duration:   QT Interval:    QTC Calculation:   R Axis:     Text Interpretation:         Labs:  Results for orders placed or performed during the hospital encounter of 05/28/18 (from the past 24 hour(s))  Urinalysis, Routine w reflex microscopic     Status: Abnormal   Collection Time: 05/28/18  7:06 PM  Result Value Ref Range   Color, Urine YELLOW YELLOW   APPearance CLEAR CLEAR   Specific Gravity, Urine <1.005 (L) 1.005 - 1.030   pH 6.5 5.0 - 8.0   Glucose, UA NEGATIVE NEGATIVE mg/dL   Hgb urine dipstick NEGATIVE NEGATIVE   Bilirubin Urine NEGATIVE NEGATIVE   Ketones, ur NEGATIVE NEGATIVE mg/dL   Protein, ur NEGATIVE NEGATIVE mg/dL   Nitrite NEGATIVE NEGATIVE   Leukocytes,Ua NEGATIVE NEGATIVE  Lipase, blood     Status: None   Collection Time: 05/28/18  9:00 PM  Result Value Ref Range   Lipase 40 11 - 51 U/L  Comprehensive metabolic panel     Status: Abnormal   Collection Time: 05/28/18  9:00 PM  Result Value Ref Range   Sodium 136 135 - 145 mmol/L   Potassium 3.4 (L) 3.5 - 5.1 mmol/L   Chloride 103 98 - 111 mmol/L   CO2 26 22 - 32 mmol/L   Glucose, Bld 107 (H) 70 - 99 mg/dL   BUN 14 6 - 20 mg/dL   Creatinine, Ser 1.00 0.61 - 1.24 mg/dL   Calcium 9.3 8.9 - 34.9 mg/dL   Total Protein 7.8 6.5 - 8.1 g/dL   Albumin 4.5 3.5 - 5.0 g/dL   AST 30 15 - 41 U/L   ALT 41 0 - 44 U/L   Alkaline Phosphatase 45 38 - 126 U/L   Total Bilirubin 0.7 0.3 - 1.2 mg/dL   GFR calc non Af Amer >60 >60 mL/min   GFR calc Af Amer >60 >60 mL/min   Anion gap 7 5 - 15  CBC     Status: None   Collection Time: 05/28/18  9:00 PM  Result Value Ref Range   WBC 6.4 4.0 - 10.5 K/uL   RBC 5.35 4.22 - 5.81 MIL/uL   Hemoglobin 14.3 13.0 - 17.0 g/dL   HCT 61.1 64.3 - 53.9 %   MCV 85.0 80.0 -  100.0 fL   MCH 26.7 26.0 - 34.0 pg   MCHC 31.4 30.0 - 36.0 g/dL   RDW 12.2 58.3 - 46.2 %   Platelets 191 150 - 400 K/uL   nRBC 0.0 0.0 - 0.2 %    Imaging Studies: Ct Abdomen Pelvis W Contrast  Result Date: 05/29/2018 CLINICAL DATA:  Acute, generalized abdominal pain. EXAM: CT ABDOMEN AND PELVIS WITH CONTRAST TECHNIQUE: Multidetector CT imaging of the abdomen and pelvis was performed using the standard protocol following bolus administration of intravenous contrast. CONTRAST:  OMNIPAQUE IOHEXOL 300 MG/ML  SOLN COMPARISON:  11/03/2017 FINDINGS: LOWER CHEST: There is no basilar pleural or apical pericardial effusion. HEPATOBILIARY: The hepatic contours and density are normal. There is no intra- or extrahepatic biliary dilatation. Status post cholecystectomy. PANCREAS: The pancreatic parenchymal contours are normal and there is no ductal dilatation. There is no peripancreatic fluid collection. SPLEEN: Normal. ADRENALS/URINARY TRACT: --Adrenal  glands: Normal. --Right kidney/ureter: No hydronephrosis, nephroureterolithiasis, perinephric stranding or solid renal mass. --Left kidney/ureter: No hydronephrosis, nephroureterolithiasis, perinephric stranding or solid renal mass. --Urinary bladder: Normal for degree of distention STOMACH/BOWEL: --Stomach/Duodenum: There is no hiatal hernia or other gastric abnormality. The duodenal course and caliber are normal. --Small bowel: No dilatation or inflammation. --Colon: No focal abnormality. --Appendix: Normal. VASCULAR/LYMPHATIC: Normal course and caliber of the major abdominal vessels. No abdominal or pelvic lymphadenopathy. REPRODUCTIVE: Normal prostate size with symmetric seminal vesicles. MUSCULOSKELETAL. No bony spinal canal stenosis or focal osseous abnormality. OTHER: Small, unremarkable periumbilical hernia. IMPRESSION: No acute abnormality of the abdomen or pelvis. Electronically Signed   By: Deatra Robinson M.D.   On: 05/29/2018 00:55      Valia Wingard, Jonny Ruiz,  MD 05/29/18 8016

## 2018-07-22 DIAGNOSIS — Z20828 Contact with and (suspected) exposure to other viral communicable diseases: Secondary | ICD-10-CM | POA: Insufficient documentation

## 2018-09-15 ENCOUNTER — Other Ambulatory Visit: Payer: Self-pay

## 2018-09-15 ENCOUNTER — Emergency Department (HOSPITAL_BASED_OUTPATIENT_CLINIC_OR_DEPARTMENT_OTHER)
Admission: EM | Admit: 2018-09-15 | Discharge: 2018-09-15 | Disposition: A | Discharge status: Home / Self Care | Payer: No Typology Code available for payment source | Attending: Emergency Medicine | Admitting: Emergency Medicine

## 2018-09-15 ENCOUNTER — Encounter (HOSPITAL_BASED_OUTPATIENT_CLINIC_OR_DEPARTMENT_OTHER): Payer: Self-pay | Admitting: *Deleted

## 2018-09-15 DIAGNOSIS — Y929 Unspecified place or not applicable: Secondary | ICD-10-CM | POA: Insufficient documentation

## 2018-09-15 DIAGNOSIS — Z23 Encounter for immunization: Secondary | ICD-10-CM | POA: Diagnosis not present

## 2018-09-15 DIAGNOSIS — Z9049 Acquired absence of other specified parts of digestive tract: Secondary | ICD-10-CM | POA: Insufficient documentation

## 2018-09-15 DIAGNOSIS — S60476A Other superficial bite of right little finger, initial encounter: Secondary | ICD-10-CM | POA: Diagnosis not present

## 2018-09-15 DIAGNOSIS — J45909 Unspecified asthma, uncomplicated: Secondary | ICD-10-CM | POA: Insufficient documentation

## 2018-09-15 DIAGNOSIS — Y998 Other external cause status: Secondary | ICD-10-CM | POA: Diagnosis not present

## 2018-09-15 DIAGNOSIS — Z87891 Personal history of nicotine dependence: Secondary | ICD-10-CM | POA: Diagnosis not present

## 2018-09-15 DIAGNOSIS — Y9389 Activity, other specified: Secondary | ICD-10-CM | POA: Diagnosis not present

## 2018-09-15 DIAGNOSIS — W5501XA Bitten by cat, initial encounter: Secondary | ICD-10-CM | POA: Diagnosis not present

## 2018-09-15 MED ORDER — AMOXICILLIN-POT CLAVULANATE 875-125 MG PO TABS
1.0000 | ORAL_TABLET | Freq: Once | ORAL | Status: AC
  Administered 2018-09-15: 1 via ORAL
  Filled 2018-09-15: qty 1

## 2018-09-15 MED ORDER — AMOXICILLIN-POT CLAVULANATE 875-125 MG PO TABS: 1.0000 | ORAL_TABLET | Freq: Two times a day (BID) | ORAL | 0 refills | Status: DC

## 2018-09-15 MED ORDER — TETANUS-DIPHTH-ACELL PERTUSSIS 5-2.5-18.5 LF-MCG/0.5 IM SUSP
0.5000 mL | Freq: Once | INTRAMUSCULAR | Status: AC
  Administered 2018-09-15: 0.5 mL via INTRAMUSCULAR
  Filled 2018-09-15: qty 0.5

## 2018-09-15 NOTE — Discharge Instructions (Addendum)
It was our pleasure to provide your ER care today - we hope that you feel better.  Keep area very clean.  Take augmentin (antibiotic) as prescribed.  Return to ER if worse, new symptoms, infection of wound, other concern.

## 2018-09-15 NOTE — ED Notes (Signed)
ED Provider at bedside. 

## 2018-09-15 NOTE — ED Triage Notes (Signed)
Pt c/o cat bite to right 5th finger x 1 hr ago

## 2018-09-15 NOTE — ED Provider Notes (Signed)
[place] EMERGENCY DEPARTMENT Provider Note   CSN: [id number] Arrival date & time: 09/15/18  1939     History   Chief Complaint Chief Complaint  Patient presents with  . Animal Bite    HPI Bryan Gill is a 38 y.o. male.     Patient indicates one of his moms cats bit his right small finger after he had picked it up and was trying to clean it. Symptoms acute onset, this evening, mild, persistent. Pt with tiny would lateral to nail of small finger. Pt notes bleeding initially. No numbness. Cat/kitten was acting normal prior to trying to clean up. Tetanus unknown. Animal known/observed.   The history is provided by the patient.  Animal Bite Associated symptoms: no fever and no numbness     Past Medical History:  Diagnosis Date  . Anxiety   . Asthma   . GERD (gastroesophageal reflux disease)   . MRSA (methicillin resistant staph aureus) culture positive     There are no active problems to display for this patient.   Past Surgical History:  Procedure Laterality Date  . CHOLECYSTECTOMY    . HERNIA REPAIR          Home Medications    Prior to Admission medications   Medication Sig Start Date End Date Taking? Authorizing Provider  dicyclomine (BENTYL) 20 MG tablet Take 1 tablet (20 mg total) by mouth 4 (four) times daily as needed (abdominal cramping). 05/29/18   Molpus, John, MD  ondansetron (ZOFRAN ODT) 8 MG disintegrating tablet Take 1 tablet (8 mg total) by mouth every 8 (eight) hours as needed for nausea or vomiting. 05/29/18   Molpus, John, MD    Family History Family History  Problem Relation Age of Onset  . Heart failure Mother   . Heart failure Father     Social History Social History   Tobacco Use  . Smoking status: Former Research scientist (life sciences)  . Smokeless tobacco: Never Used  Substance Use Topics  . Alcohol use: No  . Drug use: Yes     Allergies   Bactrim, Cephalexin, and Vicodin [hydrocodone-acetaminophen]   Review of Systems Review of  Systems  Constitutional: Negative for fever.  Skin: Positive for wound.  Neurological: Negative for numbness.     Physical Exam Updated Vital Signs BP (!) 121/97   Pulse 67   Temp 98.3 F (36.8 C)   Resp 18   Ht 1.905 m (6\' 3" )   Wt 113.4 kg   SpO2 99%   BMI 31.25 kg/m   Physical Exam Vitals signs and nursing note reviewed.  Constitutional:      Appearance: Normal appearance. He is well-developed.  HENT:     Head: Atraumatic.     Nose: Nose normal.  Eyes:     General: No scleral icterus.    Conjunctiva/sclera: Conjunctivae normal.  Neck:     Trachea: No tracheal deviation.  Cardiovascular:     Rate and Rhythm: Normal rate.     Pulses: Normal pulses.  Pulmonary:     Effort: Pulmonary effort is normal. No accessory muscle usage or respiratory distress.  Genitourinary:    Comments: No cva tenderness. Musculoskeletal:        General: No swelling.     Comments: 1-2 mm superficial wound to tip of right small finger lateral to nail. No sign of infection. Normal cap refill distally. Normal rom digit.   Skin:    General: Skin is warm and dry.     Findings: No  rash.  Neurological:     Mental Status: He is alert.     Comments: Alert, speech clear.   Psychiatric:        Mood and Affect: Mood normal.      ED Treatments / Results  Labs (all labs ordered are listed, but only abnormal results are displayed) Labs Reviewed - No data to display  EKG    Radiology No results found.  Procedures Procedures (including critical care time)  Medications Ordered in ED Medications  amoxicillin-clavulanate (AUGMENTIN) 875-125 MG per tablet 1 tablet (1 tablet Oral Given 09/15/18 2044)  Tdap (BOOSTRIX) injection 0.5 mL (0.5 mLs Intramuscular Given 09/15/18 2044)     Initial Impression / Assessment and Plan / ED Course  I have reviewed the triage vital signs and the nursing notes.  Pertinent labs & imaging results that were available during my care of the patient were  reviewed by me and considered in my medical decision making (see chart for details).  Tetanus im.  Reviewed nursing notes and prior charts for additional history.   Wound cleaned. Dressing.   No pcn allergy - confirmed w pt.   augmentin po. rx for home.     Final Clinical Impressions(s) / ED Diagnoses   Final diagnoses:  None    ED Discharge Orders    None       Cathren LaineSteinl, Daiton Cowles, MD 09/15/18 2059

## 2018-12-03 ENCOUNTER — Emergency Department (HOSPITAL_BASED_OUTPATIENT_CLINIC_OR_DEPARTMENT_OTHER)
Admission: EM | Admit: 2018-12-03 | Discharge: 2018-12-03 | Disposition: A | Discharge status: Home / Self Care | Payer: No Typology Code available for payment source | Attending: Emergency Medicine | Admitting: Emergency Medicine

## 2018-12-03 ENCOUNTER — Other Ambulatory Visit: Payer: Self-pay

## 2018-12-03 ENCOUNTER — Encounter (HOSPITAL_BASED_OUTPATIENT_CLINIC_OR_DEPARTMENT_OTHER): Payer: Self-pay | Admitting: Emergency Medicine

## 2018-12-03 DIAGNOSIS — R300 Dysuria: Secondary | ICD-10-CM | POA: Diagnosis present

## 2018-12-03 DIAGNOSIS — N410 Acute prostatitis: Secondary | ICD-10-CM | POA: Insufficient documentation

## 2018-12-03 DIAGNOSIS — J45909 Unspecified asthma, uncomplicated: Secondary | ICD-10-CM | POA: Diagnosis not present

## 2018-12-03 LAB — URINALYSIS, ROUTINE W REFLEX MICROSCOPIC
Bilirubin Urine: NEGATIVE
Glucose, UA: NEGATIVE mg/dL
Hgb urine dipstick: NEGATIVE
Ketones, ur: NEGATIVE mg/dL
Leukocytes,Ua: NEGATIVE
Nitrite: NEGATIVE
Protein, ur: NEGATIVE mg/dL
Specific Gravity, Urine: 1.025 (ref 1.005–1.030)
pH: 6.5 (ref 5.0–8.0)

## 2018-12-03 MED ORDER — DOXYCYCLINE HYCLATE 100 MG PO CAPS: 100.0000 mg | ORAL_CAPSULE | Freq: Two times a day (BID) | ORAL | 0 refills | Status: DC

## 2018-12-03 MED ORDER — AZITHROMYCIN 250 MG PO TABS
2000.0000 mg | ORAL_TABLET | Freq: Once | ORAL | Status: AC
  Administered 2018-12-03: 09:00:00 2000 mg via ORAL
  Filled 2018-12-03: qty 8

## 2018-12-03 MED FILL — DOXYCYCLINE HYCLATE 100 MG: 100 | 14 days supply | Qty: 28 | Fill #0

## 2018-12-03 NOTE — ED Provider Notes (Signed)
[facility] EMERGENCY DEPARTMENT Provider Note   CSN: [id number] Arrival date & time: 12/03/18  0734     History   Chief Complaint Chief Complaint  Patient presents with  . Dysuria    HPI Bryan Gill is a 38 y.o. male.     The history is provided by the patient.  Dysuria Presenting symptoms: dysuria and penile pain   Presenting symptoms: no penile discharge, no scrotal pain and no swelling   Context: after urination and during urination   Relieved by:  Nothing Worsened by:  Urination Ineffective treatments:  None tried Associated symptoms: abdominal pain and urinary hesitation   Associated symptoms: no genital itching, no genital lesions, no genital rash, no groin pain, no hematuria, no nausea, no penile redness, no penile swelling, no scrotal swelling, no urinary retention and no vomiting   Associated symptoms comment:  Minimal achy pain in the perineum Risk factors: unprotected sex   Risk factors: does not have multiple sexual partners, not currently sexually active and no STI exposure   Risk factors comment:  2 UTI in his lifetime but nothing recent.  sexually active with only his wife.  Only sexually active with women   Past Medical History:  Diagnosis Date  . Anxiety   . Asthma   . GERD (gastroesophageal reflux disease)   . MRSA (methicillin resistant staph aureus) culture positive     There are no active problems to display for this patient.   Past Surgical History:  Procedure Laterality Date  . CHOLECYSTECTOMY    . HERNIA REPAIR          Home Medications    Prior to Admission medications   Not on File    Family History Family History  Problem Relation Age of Onset  . Heart failure Mother   . Heart failure Father     Social History Social History   Tobacco Use  . Smoking status: Former Games developermoker  . Smokeless tobacco: Never Used  Substance Use Topics  . Alcohol use: No  . Drug use: Yes     Allergies   Sulfa antibiotics,  Bactrim, Cephalexin, and Vicodin [hydrocodone-acetaminophen]   Review of Systems Review of Systems  Gastrointestinal: Positive for abdominal pain. Negative for nausea and vomiting.  Genitourinary: Positive for dysuria, hesitancy and penile pain. Negative for discharge, hematuria, penile swelling and scrotal swelling.  All other systems reviewed and are negative.    Physical Exam Updated Vital Signs BP 125/85   Pulse 85   Temp 98.4 F (36.9 C) (Oral)   Ht 6\' 3"  (1.905 m)   Wt 113.4 kg   SpO2 99%   BMI 31.25 kg/m   Physical Exam Vitals signs and nursing note reviewed.  Constitutional:      General: He is not in acute distress.    Appearance: He is well-developed and normal weight.  HENT:     Head: Normocephalic and atraumatic.  Eyes:     Conjunctiva/sclera: Conjunctivae normal.     Pupils: Pupils are equal, round, and reactive to light.  Neck:     Musculoskeletal: Normal range of motion and neck supple.  Cardiovascular:     Rate and Rhythm: Normal rate and regular rhythm.     Pulses: Normal pulses.     Heart sounds: No murmur.  Pulmonary:     Effort: Pulmonary effort is normal. No respiratory distress.     Breath sounds: Normal breath sounds. No wheezing or rales.  Abdominal:     General:  There is no distension.     Palpations: Abdomen is soft.     Tenderness: There is abdominal tenderness in the suprapubic area. There is no right CVA tenderness, left CVA tenderness, guarding or rebound.  Genitourinary:    Penis: Normal and circumcised.      Scrotum/Testes: Normal.     Prostate: Tender.     Comments: Mild discomfort with palpation of the prostate. Musculoskeletal: Normal range of motion.        General: No tenderness.  Skin:    General: Skin is warm and dry.     Findings: No erythema or rash.  Neurological:     Mental Status: He is alert and oriented to person, place, and time.  Psychiatric:        Mood and Affect: Mood normal.        Behavior: Behavior  normal.        Thought Content: Thought content normal.      ED Treatments / Results  Labs (all labs ordered are listed, but only abnormal results are displayed) Labs Reviewed  URINALYSIS, ROUTINE W REFLEX MICROSCOPIC  GC/CHLAMYDIA PROBE AMP ([facility]) NOT AT [facility]    EKG None  Radiology No results found.  Procedures Procedures (including critical care time)  Medications Ordered in ED Medications - No data to display   Initial Impression / Assessment and Plan / ED Course  I have reviewed the triage vital signs and the nursing notes.  Pertinent labs & imaging results that were available during my care of the patient were reviewed by me and considered in my medical decision making (see chart for details).        Patient presenting with symptoms most consistent with a UTI with dysuria, hesitation and mild suprapubic pain with foul-smelling urine.  Patient has mild discomfort with prostate exam but no enlarged boggy prostate however could be early prostatitis.  Patient is sexually active with only his wife and has had no penile discharge.  STI would be of lower suspicion.  No flank pain or symptoms consistent with pyelonephritis or kidney stone.  UA is wnl.  No signs of UTI.  Will cover will cover for STI as he did have some burning at the end of the penis but will also give prolonged coverage for prostatitis.  Pt is allergice to keflex and bactrim so will treat with azithromycin 2g and then sent home with doxycycline for 14 days.  F/u with urology if not better.  Final Clinical Impressions(s) / ED Diagnoses   Final diagnoses:  Dysuria  Acute prostatitis    ED Discharge Orders         Ordered    doxycycline (VIBRAMYCIN) 100 MG capsule  2 times daily     12/03/18 0859           Blanchie Dessert, MD 12/03/18 0900

## 2018-12-03 NOTE — ED Triage Notes (Signed)
Irritation to end of penis and then burning with urination since last Tuesday.  Foul smell to urine.  Some pelvis pain.  No discharge, no fever. No rashes.

## 2018-12-04 LAB — GC/CHLAMYDIA PROBE AMP (~~LOC~~) NOT AT ARMC
Chlamydia: NEGATIVE
Neisseria Gonorrhea: NEGATIVE

## 2019-02-04 ENCOUNTER — Encounter (HOSPITAL_BASED_OUTPATIENT_CLINIC_OR_DEPARTMENT_OTHER): Payer: Self-pay | Admitting: Emergency Medicine

## 2019-02-04 ENCOUNTER — Other Ambulatory Visit: Payer: Self-pay

## 2019-02-04 ENCOUNTER — Emergency Department (HOSPITAL_BASED_OUTPATIENT_CLINIC_OR_DEPARTMENT_OTHER)
Admission: EM | Admit: 2019-02-04 | Discharge: 2019-02-04 | Disposition: A | Discharge status: Home / Self Care | Payer: PRIVATE HEALTH INSURANCE | Attending: Emergency Medicine | Admitting: Emergency Medicine

## 2019-02-04 DIAGNOSIS — J45909 Unspecified asthma, uncomplicated: Secondary | ICD-10-CM | POA: Diagnosis not present

## 2019-02-04 DIAGNOSIS — R509 Fever, unspecified: Secondary | ICD-10-CM | POA: Insufficient documentation

## 2019-02-04 DIAGNOSIS — R519 Headache, unspecified: Secondary | ICD-10-CM | POA: Insufficient documentation

## 2019-02-04 DIAGNOSIS — Z882 Allergy status to sulfonamides status: Secondary | ICD-10-CM | POA: Insufficient documentation

## 2019-02-04 DIAGNOSIS — J029 Acute pharyngitis, unspecified: Secondary | ICD-10-CM | POA: Insufficient documentation

## 2019-02-04 DIAGNOSIS — Z20822 Contact with and (suspected) exposure to covid-19: Secondary | ICD-10-CM

## 2019-02-04 DIAGNOSIS — Z881 Allergy status to other antibiotic agents status: Secondary | ICD-10-CM | POA: Diagnosis not present

## 2019-02-04 DIAGNOSIS — Z20828 Contact with and (suspected) exposure to other viral communicable diseases: Secondary | ICD-10-CM | POA: Insufficient documentation

## 2019-02-04 DIAGNOSIS — Z885 Allergy status to narcotic agent status: Secondary | ICD-10-CM | POA: Diagnosis not present

## 2019-02-04 DIAGNOSIS — M7918 Myalgia, other site: Secondary | ICD-10-CM | POA: Insufficient documentation

## 2019-02-04 DIAGNOSIS — Z87891 Personal history of nicotine dependence: Secondary | ICD-10-CM | POA: Insufficient documentation

## 2019-02-04 LAB — SARS CORONAVIRUS 2 (TAT 6-24 HRS): SARS Coronavirus 2: NEGATIVE

## 2019-02-04 MED ORDER — NAPROXEN 500 MG PO TABS: ORAL_TABLET | ORAL | 0 refills | Status: DC

## 2019-02-04 MED ORDER — NAPROXEN 250 MG PO TABS
500.0000 mg | ORAL_TABLET | Freq: Once | ORAL | Status: DC
  Filled 2019-02-04: qty 2

## 2019-02-04 MED ORDER — ONDANSETRON 8 MG PO TBDP
8.0000 mg | ORAL_TABLET | Freq: Once | ORAL | Status: AC
  Administered 2019-02-04: 8 mg via ORAL
  Filled 2019-02-04: qty 1

## 2019-02-04 MED ORDER — ONDANSETRON 8 MG PO TBDP: 8.0000 mg | ORAL_TABLET | Freq: Three times a day (TID) | ORAL | 1 refills | Status: DC | PRN

## 2019-02-04 MED ORDER — ONDANSETRON 8 MG PO TBDP: 8.0000 mg | ORAL_TABLET | Freq: Three times a day (TID) | ORAL | 1 refills | Status: AC | PRN

## 2019-02-04 NOTE — ED Provider Notes (Signed)
[place] DEPT [facility] Provider Note: Georgena Spurling, MD, FACEP  CSN: [id number] MRN: [id number] ARRIVAL: 02/04/19 at [place]: [place]  Generalized Body Aches   HISTORY OF PRESENT ILLNESS  02/04/19 4:13 AM Bryan Gill is a 38 y.o. male with a 4-day history of generalized body aches, headache and malaise.  He rates his body aches as a 7 out of 10.  He has been alternating acetaminophen and ibuprofen with partial relief of his symptoms.  He has also had nasal congestion, sore throat, fever to 100.6 and nausea.  He denies vomiting, diarrhea, change in smell or taste.  He has had occasional cough and shortness of breath for which he has gotten relief with his albuterol inhaler.   Past Medical History:  Diagnosis Date  . Anxiety   . Asthma   . GERD (gastroesophageal reflux disease)   . MRSA (methicillin resistant staph aureus) culture positive     Past Surgical History:  Procedure Laterality Date  . CHOLECYSTECTOMY    . HERNIA REPAIR      Family History  Problem Relation Age of Onset  . Heart failure Mother   . Heart failure Father     Social History   Tobacco Use  . Smoking status: Former Research scientist (life sciences)  . Smokeless tobacco: Never Used  Substance Use Topics  . Alcohol use: No  . Drug use: Yes    Prior to Admission medications   Medication Sig Start Date End Date Taking? Authorizing Provider  naproxen (NAPROSYN) 500 MG tablet Take 1 tablet twice daily as needed for body aches or fever. 02/04/19   Yaiza Palazzola, MD  ondansetron (ZOFRAN ODT) 8 MG disintegrating tablet Take 1 tablet (8 mg total) by mouth every 8 (eight) hours as needed for nausea or vomiting. 02/04/19   Heran Campau, MD    Allergies Sulfa antibiotics, Bactrim, Cephalexin, and Vicodin [hydrocodone-acetaminophen]   REVIEW OF SYSTEMS  Negative except as noted here or in the History of Present Illness.   PHYSICAL EXAMINATION  Initial Vital Signs Blood pressure 117/87, pulse 72,  temperature 98 F (36.7 C), temperature source Oral, resp. rate 18, height 6\' 3"  (1.905 m), weight 115.2 kg, SpO2 98 %.  Examination General: Well-developed, well-nourished male in no acute distress; appearance consistent with age of record HENT: normocephalic; atraumatic; no pharyngeal erythema or exudate Eyes: pupils equal, round and reactive to light; extraocular muscles intact Neck: supple Heart: regular rate and rhythm Lungs: clear to auscultation bilaterally Abdomen: soft; nondistended; nontender; bowel sounds present Extremities: No deformity; full range of motion Neurologic: Awake, alert and oriented; motor function intact in all extremities and symmetric; no facial droop Skin: Warm and dry Psychiatric: Normal mood and affect   RESULTS  Summary of this visit's results, reviewed and interpreted by myself:   EKG Interpretation  Date/Time:    Ventricular Rate:    PR Interval:    QRS Duration:   QT Interval:    QTC Calculation:   R Axis:     Text Interpretation:        Laboratory Studies: No results found for this or any previous visit (from the past 24 hour(s)). Imaging Studies: No results found.  ED COURSE and MDM  Nursing notes, initial and subsequent vitals signs, including pulse oximetry, reviewed and interpreted by myself.  Vitals:   02/04/19 0408 02/04/19 0411  BP:  117/87  Pulse:  72  Resp:  18  Temp:  98 F (36.7 C)  TempSrc:  Oral  SpO2:  98%  Weight: 115.2 kg   Height: 6\' 3"  (1.905 m)    Medications  naproxen (NAPROSYN) tablet 500 mg (has no administration in time range)  ondansetron (ZOFRAN-ODT) disintegrating tablet 8 mg (has no administration in time range)   was evaluated in Emergency Department on 02/04/2019 for the symptoms described in the history of present illness. He was evaluated in the context of the global COVID-19 pandemic, which necessitated consideration that the patient might be at risk for infection with the  SARS-CoV-2 virus that causes COVID-19. Institutional protocols and algorithms that pertain to the evaluation of patients at risk for COVID-19 are in a state of rapid change based on information released by regulatory bodies including the CDC and federal and state organizations. These policies and algorithms were followed during the patient's care in the ED.  Patient symptoms are concerning for COVID-19.  We will test him and advised him to remain isolated pending test results.  PROCEDURES  Procedures   ED DIAGNOSES     ICD-10-CM   1. Suspected COVID-19 virus infection  Z20.828        Ladasha Schnackenberg, 13/01/2019, MD 02/04/19 [id number]

## 2019-02-04 NOTE — ED Triage Notes (Signed)
Generalized body aches, fever, and headache since sat.

## 2019-02-05 ENCOUNTER — Telehealth: Payer: Self-pay | Admitting: General Practice

## 2019-02-05 NOTE — Telephone Encounter (Signed)
This encounter was created in error - please disregard.

## 2019-02-05 NOTE — Telephone Encounter (Signed)
Negative COVID results given. Patient results "NOT Detected." Caller expressed understanding. ° °

## 2019-07-27 ENCOUNTER — Emergency Department (HOSPITAL_BASED_OUTPATIENT_CLINIC_OR_DEPARTMENT_OTHER)
Admission: EM | Admit: 2019-07-27 | Discharge: 2019-07-27 | Disposition: A | Discharge status: Home / Self Care | Payer: No Typology Code available for payment source | Attending: Emergency Medicine | Admitting: Emergency Medicine

## 2019-07-27 ENCOUNTER — Encounter (HOSPITAL_BASED_OUTPATIENT_CLINIC_OR_DEPARTMENT_OTHER): Payer: Self-pay | Admitting: *Deleted

## 2019-07-27 ENCOUNTER — Other Ambulatory Visit: Payer: Self-pay

## 2019-07-27 DIAGNOSIS — Z114 Encounter for screening for human immunodeficiency virus [HIV]: Secondary | ICD-10-CM | POA: Diagnosis not present

## 2019-07-27 DIAGNOSIS — Z113 Encounter for screening for infections with a predominantly sexual mode of transmission: Secondary | ICD-10-CM | POA: Diagnosis not present

## 2019-07-27 DIAGNOSIS — J029 Acute pharyngitis, unspecified: Secondary | ICD-10-CM | POA: Diagnosis present

## 2019-07-27 DIAGNOSIS — K1370 Unspecified lesions of oral mucosa: Secondary | ICD-10-CM | POA: Insufficient documentation

## 2019-07-27 LAB — GROUP A STREP BY PCR: Group A Strep by PCR: NOT DETECTED

## 2019-07-27 LAB — HIV ANTIBODY (ROUTINE TESTING W REFLEX): HIV Screen 4th Generation wRfx: NONREACTIVE

## 2019-07-27 MED ORDER — LIDOCAINE VISCOUS HCL 2 % MT SOLN: 15.0000 mL | OROMUCOSAL | 2 refills | Status: AC | PRN

## 2019-07-27 MED FILL — LIDOCAINE 2% VISCOUS SOLN: 2 | 2 days supply | Qty: 100 | Fill #0

## 2019-07-27 NOTE — ED Provider Notes (Signed)
[facility] EMERGENCY DEPARTMENT Provider Note   CSN: [id number] Arrival date & time: 07/27/19  1207     History Chief Complaint  Patient presents with  . Sore Throat    Bryan Gill is a 39 y.o. male.  HPI      Bryan Gill is a 39 y.o. male, with a history of anxiety, asthma, GERD, presenting to the ED with sore throat beginning a few days ago.  He also noticed some pain in his gums. He engaged in oral sex with a male partner and is concerned his symptoms may be related.  This is not a new partner for him. Denies fever/chills, difficulty swallowing, shortness of breath, cough, mouth swelling, N/V/D, chest pain, or any other complaints.    Past Medical History:  Diagnosis Date  . Anxiety   . Asthma   . GERD (gastroesophageal reflux disease)   . MRSA (methicillin resistant staph aureus) culture positive     There are no problems to display for this patient.   Past Surgical History:  Procedure Laterality Date  . CHOLECYSTECTOMY    . HERNIA REPAIR         Family History  Problem Relation Age of Onset  . Heart failure Mother   . Heart failure Father     Social History   Tobacco Use  . Smoking status: Former Games developer  . Smokeless tobacco: Never Used  Substance Use Topics  . Alcohol use: No  . Drug use: Yes    Home Medications Prior to Admission medications   Medication Sig Start Date End Date Taking? Authorizing Provider  lidocaine (XYLOCAINE) 2 % solution Use as directed 15 mLs in the mouth or throat as needed for mouth pain. 07/27/19   Finas Delone C, [name]  ondansetron (ZOFRAN ODT) 8 MG disintegrating tablet Take 1 tablet (8 mg total) by mouth every 8 (eight) hours as needed for nausea or vomiting. 02/04/19   Molpus, John, MD    Allergies    Sulfa antibiotics, Bactrim, Cephalexin, and Vicodin [hydrocodone-acetaminophen]  Review of Systems   Review of Systems  Constitutional: Negative for chills and fever.  HENT: Positive for mouth  sores.   Respiratory: Negative for cough and shortness of breath.   Cardiovascular: Negative for chest pain.  Gastrointestinal: Negative for abdominal pain, diarrhea, nausea and vomiting.  Neurological: Negative for dizziness and headaches.    Physical Exam Updated Vital Signs BP 121/87   Pulse 80   Temp 98.3 F (36.8 C) (Oral)   Resp 20   Ht 6\' 3"  (1.905 m)   Wt 115.2 kg   SpO2 99%   BMI 31.74 kg/m   Physical Exam Vitals and nursing note reviewed.  Constitutional:      General: He is not in acute distress.    Appearance: He is well-developed. He is not diaphoretic.  HENT:     Head: Normocephalic and atraumatic.     Right Ear: Tympanic membrane, ear canal and external ear normal.     Left Ear: Tympanic membrane, ear canal and external ear normal.     Ears:     Comments: No lesions in the ear canals.    Nose: Nose normal.     Mouth/Throat:     Mouth: Mucous membranes are moist.     Pharynx: Posterior oropharyngeal erythema present.     Comments: Some mild erythema to the throat.  No lesions noted in the back of the throat. Tiny, tender lesions as shown in the pictures. Eyes:  Conjunctiva/sclera: Conjunctivae normal.  Cardiovascular:     Rate and Rhythm: Normal rate and regular rhythm.  Pulmonary:     Effort: Pulmonary effort is normal.  Musculoskeletal:     Cervical back: Normal range of motion and neck supple.  Lymphadenopathy:     Cervical: No cervical adenopathy.  Skin:    General: Skin is warm and dry.     Coloration: Skin is not pale.  Neurological:     Mental Status: He is alert.  Psychiatric:        Behavior: Behavior normal.                ED Results / Procedures / Treatments   Labs (all labs ordered are listed, but only abnormal results are displayed) Labs Reviewed  GROUP A STREP BY PCR  RPR  HIV ANTIBODY (ROUTINE TESTING W REFLEX)  GC/CHLAMYDIA PROBE AMP ([place]) NOT AT [facility]    EKG None  Radiology No results  found.  Procedures Procedures (including critical care time)  Medications Ordered in ED Medications - No data to display  ED Course  I have reviewed the triage vital signs and the nursing notes.  Pertinent labs & imaging results that were available during my care of the patient were reviewed by me and considered in my medical decision making (see chart for details).    MDM Rules/Calculators/A&P                      Patient presents with sore throat and mouth pain. Patient is nontoxic appearing, afebrile, not tachycardic, not tachypneic, not hypotensive, maintains excellent SPO2 on room air, and is in no apparent distress.  Strep test negative.  STD testing pending at time of discharge. The patient was given instructions for home care as well as return precautions. Patient voices understanding of these instructions, accepts the plan, and is comfortable with discharge.  Findings and plan of care discussed with Ronnald Ramp, MD. We reviewed the pictures of the patient's lesions together.    Final Clinical Impression(s) / ED Diagnoses Final diagnoses:  Sore throat    Rx / DC Orders ED Discharge Orders         Ordered    lidocaine (XYLOCAINE) 2 % solution  As needed     07/27/19 1320           Lorayne Bender, [name] 07/30/19 1506    Hayden Rasmussen, MD 07/30/19 1907

## 2019-07-27 NOTE — Discharge Instructions (Addendum)
Sore Throat  You have been seen today for sore throat.  The strep test was negative.  This indicates a viral infection.  Viral infections do not respond to antibiotics.  Your body has to fight off the infection and it needs to run its course. Hand washing: Wash your hands throughout the day, but especially before and after touching the face, using the restroom, sneezing, coughing, or touching surfaces that have been coughed or sneezed upon. Hydration: Symptoms will be intensified and complicated by dehydration. Dehydration can also extend the duration of symptoms. Drink plenty of fluids and get plenty of rest. You should be drinking at least half a liter of water an hour to stay hydrated. Electrolyte drinks (ex. Gatorade, Powerade, Pedialyte) are also encouraged. You should be drinking enough fluids to make your urine light yellow, almost clear. If this is not the case, you are not drinking enough water. Please note that some of the treatments indicated below will not be effective if you are not adequately hydrated. Diet: Please concentrate on hydration, however, you may introduce food slowly.  Start with a clear liquid diet, progressed to a full liquid diet, and then bland solids as you are able. Pain or fever: Ibuprofen, Naproxen, or Tylenol for pain or fever. (see below for suggested regimen) Antiinflammatory medications: Take 600 mg of ibuprofen every 6 hours or 440 mg (over the counter dose) to 500 mg (prescription dose) of naproxen every 12 hours for the next 3 days. After this time, these medications may be used as needed for pain. Take these medications with food to avoid upset stomach. Choose only one of these medications, do not take them together. Tylenol: Should you continue to have additional pain while taking the ibuprofen or naproxen, you may add in tylenol as needed. Your daily total maximum amount of tylenol from all sources should be limited to 4000mg /day for persons without liver  problems, or 2000mg /day for those with liver problems. Sore throat: Warm liquids or Chloraseptic spray may help soothe a sore throat. Gargle twice a day with a salt water solution made from a half teaspoon of salt in a cup of warm water.  Viscous lidocaine: This medication can be used to swish within the mouth to numb areas of discomfort.  Swish and spit out the remaining liquid.  Do not swallow it. Follow up: Follow up with a primary care provider, as needed, for any future management of this issue.  Follow up with the ear nose and throat (ENT) specialist should mouth ulcers fail to resolve.  For prescription assistance, may try using prescription discount sites or apps, such as goodrx.com  You have been tested for STDs. Some of these results are still pending. Any abnormalities will be called to you. Be sure to follow safe sex practices, including monogamy and/or condom use. All sexual partners must also be notified and treated.

## 2019-07-27 NOTE — ED Notes (Signed)
Pt unable to void at this time.  Water given to patient.

## 2019-07-27 NOTE — ED Triage Notes (Addendum)
Sore throat x 3 days. Mouth ulcers that are painful. Pt states symptoms started a few days after having oral sex.

## 2019-07-28 LAB — RPR: RPR Ser Ql: NONREACTIVE

## 2019-07-28 LAB — GC/CHLAMYDIA PROBE AMP (~~LOC~~) NOT AT ARMC
Chlamydia: NEGATIVE
Comment: NEGATIVE
Comment: NORMAL
Neisseria Gonorrhea: NEGATIVE

## 2019-11-22 ENCOUNTER — Encounter (HOSPITAL_BASED_OUTPATIENT_CLINIC_OR_DEPARTMENT_OTHER): Payer: Self-pay | Admitting: Emergency Medicine

## 2019-11-22 ENCOUNTER — Emergency Department (HOSPITAL_BASED_OUTPATIENT_CLINIC_OR_DEPARTMENT_OTHER)
Admission: EM | Admit: 2019-11-22 | Discharge: 2019-11-22 | Disposition: A | Discharge status: Home / Self Care | Payer: No Typology Code available for payment source | Attending: Emergency Medicine | Admitting: Emergency Medicine

## 2019-11-22 ENCOUNTER — Other Ambulatory Visit: Payer: Self-pay

## 2019-11-22 DIAGNOSIS — J45909 Unspecified asthma, uncomplicated: Secondary | ICD-10-CM | POA: Diagnosis not present

## 2019-11-22 DIAGNOSIS — Z87891 Personal history of nicotine dependence: Secondary | ICD-10-CM | POA: Diagnosis not present

## 2019-11-22 DIAGNOSIS — Z20822 Contact with and (suspected) exposure to covid-19: Secondary | ICD-10-CM | POA: Diagnosis not present

## 2019-11-22 DIAGNOSIS — R070 Pain in throat: Secondary | ICD-10-CM | POA: Diagnosis present

## 2019-11-22 DIAGNOSIS — J069 Acute upper respiratory infection, unspecified: Secondary | ICD-10-CM

## 2019-11-22 DIAGNOSIS — J029 Acute pharyngitis, unspecified: Secondary | ICD-10-CM | POA: Insufficient documentation

## 2019-11-22 LAB — SARS CORONAVIRUS 2 BY RT PCR (HOSPITAL ORDER, PERFORMED IN ~~LOC~~ HOSPITAL LAB): SARS Coronavirus 2: NEGATIVE

## 2019-11-22 MED ORDER — IBUPROFEN 800 MG PO TABS
800.0000 mg | ORAL_TABLET | Freq: Once | ORAL | Status: AC
  Administered 2019-11-22: 800 mg via ORAL
  Filled 2019-11-22: qty 1

## 2019-11-22 NOTE — ED Provider Notes (Signed)
[facility] EMERGENCY DEPARTMENT Provider Note   CSN: [id number] Arrival date & time: 11/22/19  4854     History Chief Complaint  Patient presents with  . flu like symptoms    Bryan Gill is a 39 y.o. male.  The history is provided by the patient.  URI Presenting symptoms: congestion, rhinorrhea and sore throat   Presenting symptoms: no fever   Severity:  Mild Onset quality:  Gradual Duration:  3 days Timing:  Constant Progression:  Unchanged Chronicity:  New Relieved by:  Nothing Worsened by:  Nothing Ineffective treatments:  None tried Associated symptoms: myalgias   Associated symptoms: no arthralgias, no headaches, no neck pain, no swollen glands and no wheezing   Risk factors: not elderly   Is here over concern for covid.       Past Medical History:  Diagnosis Date  . Anxiety   . Asthma   . GERD (gastroesophageal reflux disease)   . MRSA (methicillin resistant staph aureus) culture positive     There are no problems to display for this patient.   Past Surgical History:  Procedure Laterality Date  . CHOLECYSTECTOMY    . HERNIA REPAIR         Family History  Problem Relation Age of Onset  . Heart failure Mother   . Heart failure Father     Social History   Tobacco Use  . Smoking status: Former Games developer  . Smokeless tobacco: Never Used  Vaping Use  . Vaping Use: Never used  Substance Use Topics  . Alcohol use: Yes    Comment: socially  . Drug use: Yes    Home Medications Prior to Admission medications   Medication Sig Start Date End Date Taking? Authorizing Provider  lidocaine (XYLOCAINE) 2 % solution Use as directed 15 mLs in the mouth or throat as needed for mouth pain. 07/27/19   Joy, Shawn C, [name]  ondansetron (ZOFRAN ODT) 8 MG disintegrating tablet Take 1 tablet (8 mg total) by mouth every 8 (eight) hours as needed for nausea or vomiting. 02/04/19   Molpus, Jonny Ruiz, MD    Allergies    Sulfa antibiotics, Bactrim,  Cephalexin, and Vicodin [hydrocodone-acetaminophen]  Review of Systems   Review of Systems  Constitutional: Negative for fever.  HENT: Positive for congestion, rhinorrhea and sore throat.   Eyes: Negative for visual disturbance.  Respiratory: Negative for wheezing.   Gastrointestinal: Negative for abdominal pain.  Genitourinary: Negative for difficulty urinating.  Musculoskeletal: Positive for myalgias. Negative for arthralgias and neck pain.  Neurological: Negative for headaches.  Psychiatric/Behavioral: Negative for agitation.  All other systems reviewed and are negative.   Physical Exam Updated Vital Signs BP (!) 148/77   Pulse 81   Temp 98.6 F (37 C)   Resp 16   SpO2 98%   Physical Exam Vitals and nursing note reviewed.  Constitutional:      General: He is not in acute distress.    Appearance: Normal appearance.  HENT:     Head: Normocephalic and atraumatic.     Nose: Nose normal.  Eyes:     Conjunctiva/sclera: Conjunctivae normal.     Pupils: Pupils are equal, round, and reactive to light.  Cardiovascular:     Rate and Rhythm: Normal rate and regular rhythm.     Pulses: Normal pulses.     Heart sounds: Normal heart sounds.  Pulmonary:     Effort: Pulmonary effort is normal.     Breath sounds: Normal breath sounds.  Abdominal:     General: Abdomen is flat. Bowel sounds are normal.     Palpations: Abdomen is soft.     Tenderness: There is no abdominal tenderness.  Musculoskeletal:        General: Normal range of motion.     Cervical back: Normal range of motion and neck supple. No rigidity.  Lymphadenopathy:     Cervical: No cervical adenopathy.  Skin:    General: Skin is warm and dry.     Capillary Refill: Capillary refill takes less than 2 seconds.  Neurological:     General: No focal deficit present.     Mental Status: He is alert and oriented to person, place, and time.     Deep Tendon Reflexes: Reflexes normal.  Psychiatric:        Mood and Affect:  Mood normal.        Behavior: Behavior normal.     ED Results / Procedures / Treatments   Labs (all labs ordered are listed, but only abnormal results are displayed) Results for orders placed or performed during the hospital encounter of 11/22/19  SARS Coronavirus 2 by RT PCR (hospital order, performed in [facility] hospital lab) Nasopharyngeal Nasopharyngeal Swab   Specimen: Nasopharyngeal Swab  Result Value Ref Range   SARS Coronavirus 2 NEGATIVE NEGATIVE   No results found.  Radiology No results found.  Procedures Procedures (including critical care time)  Medications Ordered in ED Medications  ibuprofen (ADVIL) tablet 800 mg (has no administration in time range)    ED Course  I have reviewed the triage vital signs and the nursing notes.  Pertinent labs & imaging results that were available during my care of the patient were reviewed by me and considered in my medical decision making (see chart for details).    Covid is negative.  Take tylenol and ibuprofen and drink copious liquids.    Bryan Gill was evaluated in Emergency Department on 11/22/2019 for the symptoms described in the history of present illness. He was evaluated in the context of the global COVID-19 pandemic, which necessitated consideration that the patient might be at risk for infection with the SARS-CoV-2 virus that causes COVID-19. Institutional protocols and algorithms that pertain to the evaluation of patients at risk for COVID-19 are in a state of rapid change based on information released by regulatory bodies including the CDC and federal and state organizations. These policies and algorithms were followed during the patient's care in the ED.  Final Clinical Impression(s) / ED Diagnoses Return for intractable cough, coughing up blood,fevers >100.4 unrelieved by medication, shortness of breath, intractable vomiting, chest pain, shortness of breath, weakness,numbness, changes in speech, facial  asymmetry,abdominal pain, passing out,Inability to tolerate liquids or food, cough, altered mental status or any concerns. No signs of systemic illness or infection. The patient is nontoxic-appearing on exam and vital signs are within normal limits.   I have reviewed the triage vital signs and the nursing notes. Pertinent labs &imaging results that were available during my care of the patient were reviewed by me and considered in my medical decision making (see chart for details).After history, exam, and medical workup I feel the patient has beenappropriately medically screened and is safe for discharge home. Pertinent diagnoses were discussed with the patient. Patient was given return precautions.   Deyci Gesell, MD 11/22/19 6295

## 2019-11-22 NOTE — ED Triage Notes (Signed)
Pt reports generalized body aches, headaches, sore throat and intermittent shortness of breath since Wednesday.

## 2020-01-09 ENCOUNTER — Other Ambulatory Visit: Payer: No Typology Code available for payment source

## 2020-01-09 DIAGNOSIS — Z20822 Contact with and (suspected) exposure to covid-19: Secondary | ICD-10-CM

## 2020-01-10 LAB — NOVEL CORONAVIRUS, NAA: SARS-CoV-2, NAA: NOT DETECTED

## 2020-01-10 LAB — SARS-COV-2, NAA 2 DAY TAT

## 2020-03-19 ENCOUNTER — Encounter (HOSPITAL_BASED_OUTPATIENT_CLINIC_OR_DEPARTMENT_OTHER): Payer: Self-pay | Admitting: Emergency Medicine

## 2020-03-19 ENCOUNTER — Other Ambulatory Visit: Payer: Self-pay

## 2020-03-19 ENCOUNTER — Emergency Department (HOSPITAL_BASED_OUTPATIENT_CLINIC_OR_DEPARTMENT_OTHER)
Admission: EM | Admit: 2020-03-19 | Discharge: 2020-03-19 | Disposition: A | Discharge status: Home / Self Care | Payer: HRSA Program | Attending: Emergency Medicine | Admitting: Emergency Medicine

## 2020-03-19 DIAGNOSIS — R059 Cough, unspecified: Secondary | ICD-10-CM | POA: Diagnosis present

## 2020-03-19 DIAGNOSIS — J45909 Unspecified asthma, uncomplicated: Secondary | ICD-10-CM | POA: Diagnosis not present

## 2020-03-19 DIAGNOSIS — Z87891 Personal history of nicotine dependence: Secondary | ICD-10-CM | POA: Diagnosis not present

## 2020-03-19 DIAGNOSIS — U071 COVID-19: Secondary | ICD-10-CM

## 2020-03-19 DIAGNOSIS — Z7952 Long term (current) use of systemic steroids: Secondary | ICD-10-CM | POA: Diagnosis not present

## 2020-03-19 LAB — RESP PANEL BY RT-PCR (FLU A&B, COVID) ARPGX2
Influenza A by PCR: NEGATIVE
Influenza B by PCR: NEGATIVE
SARS Coronavirus 2 by RT PCR: POSITIVE — AB

## 2020-03-19 MED ORDER — ALBUTEROL SULFATE HFA 108 (90 BASE) MCG/ACT IN AERS: 1.0000 | INHALATION_SPRAY | RESPIRATORY_TRACT | 1 refills | Status: DC | PRN

## 2020-03-19 MED ORDER — BENZONATATE 200 MG PO CAPS: 200.0000 mg | ORAL_CAPSULE | Freq: Three times a day (TID) | ORAL | 0 refills | Status: DC

## 2020-03-19 MED ORDER — BENZONATATE 200 MG PO CAPS: 200.0000 mg | ORAL_CAPSULE | Freq: Three times a day (TID) | ORAL | 0 refills | Status: AC

## 2020-03-19 MED ORDER — ALBUTEROL SULFATE HFA 108 (90 BASE) MCG/ACT IN AERS: 1.0000 | INHALATION_SPRAY | RESPIRATORY_TRACT | 1 refills | Status: AC | PRN

## 2020-03-19 NOTE — ED Provider Notes (Signed)
[facility] EMERGENCY DEPARTMENT Provider Note   CSN: [id number] Arrival date & time: 03/19/20  1224     History Chief Complaint  Patient presents with  . Cough    Bryan Gill is a 40 y.o. male.  39 year old male presents with complaint of headache, body aches, dizziness, cough, shortness of breath, congestion and fever.  Patient states her symptoms started 2 days ago, wife has tested positive for Covid, patient is not vaccinated.  Patient does have a history of asthma, does not currently use an inhaler as his is expired.  Non-smoker.  No other complaints or concerns.  Bryan Gill was evaluated in Emergency Department on 03/19/2020 for the symptoms described in the history of present illness. He was evaluated in the context of the global COVID-19 pandemic, which necessitated consideration that the patient might be at risk for infection with the SARS-CoV-2 virus that causes COVID-19. Institutional protocols and algorithms that pertain to the evaluation of patients at risk for COVID-19 are in a state of rapid change based on information released by regulatory bodies including the CDC and federal and state organizations. These policies and algorithms were followed during the patient's care in the ED.         Past Medical History:  Diagnosis Date  . Anxiety   . Asthma   . GERD (gastroesophageal reflux disease)   . MRSA (methicillin resistant staph aureus) culture positive     There are no problems to display for this patient.   Past Surgical History:  Procedure Laterality Date  . CHOLECYSTECTOMY    . HERNIA REPAIR         Family History  Problem Relation Age of Onset  . Heart failure Mother   . Heart failure Father     Social History   Tobacco Use  . Smoking status: Former Games developer  . Smokeless tobacco: Never Used  Vaping Use  . Vaping Use: Never used  Substance Use Topics  . Alcohol use: Yes    Comment: socially  . Drug use: Not Currently     Home Medications Prior to Admission medications   Medication Sig Start Date End Date Taking? Authorizing Provider  albuterol (VENTOLIN HFA) 108 (90 Base) MCG/ACT inhaler Inhale 1-2 puffs into the lungs every 4 (four) hours as needed for wheezing or shortness of breath. 03/19/20   Jeannie Fend, [name]  benzonatate (TESSALON) 200 MG capsule Take 1 capsule (200 mg total) by mouth every 8 (eight) hours for 10 days. 03/19/20 03/29/20  Army Melia A, [name]  lidocaine (XYLOCAINE) 2 % solution Use as directed 15 mLs in the mouth or throat as needed for mouth pain. 07/27/19   Joy, Shawn C, [name]  ondansetron (ZOFRAN ODT) 8 MG disintegrating tablet Take 1 tablet (8 mg total) by mouth every 8 (eight) hours as needed for nausea or vomiting. 02/04/19   Molpus, John, MD    Allergies    Sulfa antibiotics, Bactrim, Cephalexin, and Vicodin [hydrocodone-acetaminophen]  Review of Systems   Review of Systems  Constitutional: Positive for chills and fever.  HENT: Positive for congestion and sore throat.   Respiratory: Positive for cough and shortness of breath.   Gastrointestinal: Negative for nausea and vomiting.  Musculoskeletal: Positive for arthralgias and myalgias.  Skin: Negative for rash.  Allergic/Immunologic: Negative for immunocompromised state.  Neurological: Positive for dizziness and headaches.  Hematological: Negative for adenopathy.  All other systems reviewed and are negative.   Physical Exam Updated Vital Signs BP 120/70  Pulse 90   Temp (!) 101.5 F (38.6 C) (Oral)   Resp 20   Ht 6\' 3"  (1.905 m)   Wt 115.7 kg   SpO2 100%   BMI 31.87 kg/m   Physical Exam Vitals and nursing note reviewed.  Constitutional:      General: He is not in acute distress.    Appearance: He is well-developed and well-nourished. He is not diaphoretic.  HENT:     Head: Normocephalic and atraumatic.  Eyes:     Conjunctiva/sclera: Conjunctivae normal.  Cardiovascular:     Rate and Rhythm: Normal  rate and regular rhythm.     Pulses: Normal pulses.     Heart sounds: Normal heart sounds.  Pulmonary:     Effort: Pulmonary effort is normal.     Breath sounds: Normal breath sounds.  Musculoskeletal:     Cervical back: Neck supple.  Lymphadenopathy:     Cervical: No cervical adenopathy.  Skin:    General: Skin is warm and dry.     Findings: No erythema or rash.  Neurological:     Mental Status: He is alert and oriented to person, place, and time.  Psychiatric:        Mood and Affect: Mood and affect normal.        Behavior: Behavior normal.     ED Results / Procedures / Treatments   Labs (all labs ordered are listed, but only abnormal results are displayed) Labs Reviewed  RESP PANEL BY RT-PCR (FLU A&B, COVID) ARPGX2 - Abnormal; Notable for the following components:      Result Value   SARS Coronavirus 2 by RT PCR POSITIVE (*)    All other components within normal limits    EKG None  Radiology No results found.  Procedures Procedures (including critical care time)  Medications Ordered in ED Medications - No data to display  ED Course  I have reviewed the triage vital signs and the nursing notes.  Pertinent labs & imaging results that were available during my care of the patient were reviewed by me and considered in my medical decision making (see chart for details).  Clinical Course as of 03/19/20 1421  Sat Mar 19, 2020  2310 39 year old male with complaint as above, has tested positive for Covid.  Patient appears to feel unwell however nontoxic.  Plan is to refill inhaler, will also give prescription for Tessalon, recommend Keflex, recommend Motrin Tylenol for fevers and body aches and quarantine at home.  Given information for Covid clinic for follow-up, return to ED for worsening or concerning symptoms. [LM]    Clinical Course User Index [LM] 30   MDM Rules/Calculators/A&P                          Final Clinical Impression(s) / ED  Diagnoses Final diagnoses:  COVID-19    Rx / DC Orders ED Discharge Orders         Ordered    albuterol (VENTOLIN HFA) 108 (90 Base) MCG/ACT inhaler  Every 4 hours PRN        03/19/20 1420    benzonatate (TESSALON) 200 MG capsule  Every 8 hours        03/19/20 1420           03/21/20 03/19/20 1421    03/21/20, MD 03/20/20 [phone]

## 2020-03-19 NOTE — ED Notes (Signed)
Presents with HA, fatigue, feeling dizzy at times, non-prod cough, states has not rec Covid Vaccines, wife is at home and Covid Positive

## 2020-03-19 NOTE — Discharge Instructions (Signed)
Motrin and Tylenol as instructed for fever and body aches. Use inhaler as needed as prescribed.  Take Tessalon as needed as prescribed. Take Mucinex as directed.

## 2020-03-19 NOTE — ED Notes (Signed)
ED Provider at bedside. 

## 2020-03-19 NOTE — ED Triage Notes (Signed)
Ptg arrives pov with c/o Covid exposure. Pt endorse HA, chills, fever, cough, body aches. Temp 101.5 in triage, reportsTylenol at 12 today

## 2020-03-20 ENCOUNTER — Telehealth: Payer: Self-pay | Admitting: Unknown Physician Specialty

## 2020-03-20 NOTE — Telephone Encounter (Signed)
Called to discuss with Bryan Gill about Covid symptoms and the use of  monoclonal antibody infusion for those with mild to moderate Covid symptoms and at a high risk of hospitalization.     Pt is qualified for this infusion due to co-morbid conditions and/or a member of an at-risk group, however wants to think about it.  Information given on mychart and number to hotline

## 2020-03-26 ENCOUNTER — Encounter (HOSPITAL_BASED_OUTPATIENT_CLINIC_OR_DEPARTMENT_OTHER): Payer: Self-pay | Admitting: Emergency Medicine

## 2020-03-26 ENCOUNTER — Emergency Department (HOSPITAL_BASED_OUTPATIENT_CLINIC_OR_DEPARTMENT_OTHER)
Admission: EM | Admit: 2020-03-26 | Discharge: 2020-03-26 | Disposition: A | Discharge status: Home / Self Care | Payer: HRSA Program | Attending: Emergency Medicine | Admitting: Emergency Medicine

## 2020-03-26 ENCOUNTER — Other Ambulatory Visit: Payer: Self-pay

## 2020-03-26 DIAGNOSIS — Z5321 Procedure and treatment not carried out due to patient leaving prior to being seen by health care provider: Secondary | ICD-10-CM | POA: Insufficient documentation

## 2020-03-26 DIAGNOSIS — U071 COVID-19: Secondary | ICD-10-CM | POA: Diagnosis not present

## 2020-03-26 DIAGNOSIS — R509 Fever, unspecified: Secondary | ICD-10-CM | POA: Diagnosis present

## 2020-03-26 NOTE — ED Notes (Signed)
Per registration, pt left at this time. LWBS.

## 2020-03-26 NOTE — ED Triage Notes (Signed)
Reports he was diagnosed a week ago with covid.  Here due to continued fevers.  Max fever today of 102.  Tylenol taken around 1900.

## 2020-03-29 ENCOUNTER — Telehealth: Payer: Self-pay | Admitting: Nurse Practitioner

## 2020-03-29 NOTE — Telephone Encounter (Signed)
[place] [phone] called pt mobile phone [phone] LVM for ret call to schedule appt.

## 2020-03-29 NOTE — Telephone Encounter (Signed)
Pt returning call to schedule an appt. Please advise.

## 2020-03-30 ENCOUNTER — Telehealth (INDEPENDENT_AMBULATORY_CARE_PROVIDER_SITE_OTHER): Payer: HRSA Program | Admitting: Nurse Practitioner

## 2020-03-30 DIAGNOSIS — J45909 Unspecified asthma, uncomplicated: Secondary | ICD-10-CM | POA: Insufficient documentation

## 2020-03-30 DIAGNOSIS — R1310 Dysphagia, unspecified: Secondary | ICD-10-CM | POA: Insufficient documentation

## 2020-03-30 DIAGNOSIS — R002 Palpitations: Secondary | ICD-10-CM | POA: Insufficient documentation

## 2020-03-30 DIAGNOSIS — U071 COVID-19: Secondary | ICD-10-CM | POA: Diagnosis not present

## 2020-03-30 DIAGNOSIS — K219 Gastro-esophageal reflux disease without esophagitis: Secondary | ICD-10-CM | POA: Insufficient documentation

## 2020-03-30 MED ORDER — CYCLOBENZAPRINE HCL 10 MG PO TABS: 10.0000 mg | ORAL_TABLET | Freq: Three times a day (TID) | ORAL | 0 refills | Status: AC | PRN

## 2020-03-30 MED ORDER — IBUPROFEN 600 MG PO TABS: 600.0000 mg | ORAL_TABLET | Freq: Three times a day (TID) | ORAL | 0 refills | Status: AC | PRN

## 2020-03-30 MED ORDER — AZITHROMYCIN 250 MG PO TABS: ORAL_TABLET | ORAL | 0 refills | Status: AC

## 2020-03-30 NOTE — Patient Instructions (Addendum)
Covid 19 Cough Fever Headache:   Stay well hydrated  Stay active  Deep breathing exercises  May take tylenol or fever or pain  May take delsym twice daily  Will order prednisone  Will order azithromycin  Will order ibuprofen and flexeril for neck pain and headache    Follow up:  Follow up if needed

## 2020-03-30 NOTE — Progress Notes (Signed)
Virtual Visit via Telephone Note  I connected with Bryan Gill on 03/30/20 at  2:30 PM EST by telephone and verified that I am speaking with the correct person using two identifiers.  Location: Patient: home Provider: office   I discussed the limitations, risks, security and privacy concerns of performing an evaluation and management service by telephone and the availability of in person appointments. I also discussed with the patient that there may be a patient responsible charge related to this service. The patient expressed understanding and agreed to proceed.   History of Present Illness:  Patient presents today for televisit for Covid.  Patient was diagnosed on 03/19/2020 and a ED visit.  Patient was given albuterol and Tessalon Perles.  Patient still complains of body aches, headaches, fever of 100.6 F.  Patient states that he does still have significant cough.  He is trying to stay well-hydrated and has been eating well. Denies f/c/s, n/v/d, hemoptysis, PND, chest pain or edema.    Observations/Objective:  Vitals with BMI 03/26/2020 03/19/2020 03/19/2020  Height 6\' 3"  - -  Weight 255 lbs - -  BMI 31.87 - -  Systolic 129 120  Diastolic 98 70 70  Pulse 114 90 100      Assessment and Plan:  Covid 19 Cough Fever Headache:   Stay well hydrated  Stay active  Deep breathing exercises  May take tylenol or fever or pain  May take delsym twice daily  Will order prednisone  Will order azithromycin  Will order ibuprofen and flexeril for neck pain and headache   Follow Up Instructions:  Follow up if needed     I discussed the assessment and treatment plan with the patient. The patient was provided an opportunity to ask questions and all were answered. The patient agreed with the plan and demonstrated an understanding of the instructions.   The patient was advised to call back or seek an in-person evaluation if the symptoms worsen or if the condition fails  to improve as anticipated.  I provided 22 minutes of non-face-to-face time during this encounter.   [phone], NP

## 2020-09-19 IMAGING — CT CT ABD-PELV W/ CM
2 of 5 series · 16 of 46 positions shown, 18 images · IV contrast (APPLIED)
Comparison: 11/03/2017

CLINICAL DATA: Acute, generalized abdominal pain.

EXAM:
CT ABDOMEN AND PELVIS WITH CONTRAST
TECHNIQUE: Multidetector CT imaging of the abdomen and pelvis was performed
using the standard protocol following bolus administration of
intravenous contrast.
CONTRAST:  100mL OMNIPAQUE IOHEXOL 300 MG/ML  SOLN

[Series 2: axial st · axial · 0.88mm/px · z∈[-491,-21]mm · 13 of 106 slices shown, 15 images]
[im 6/106  soft-tissue]
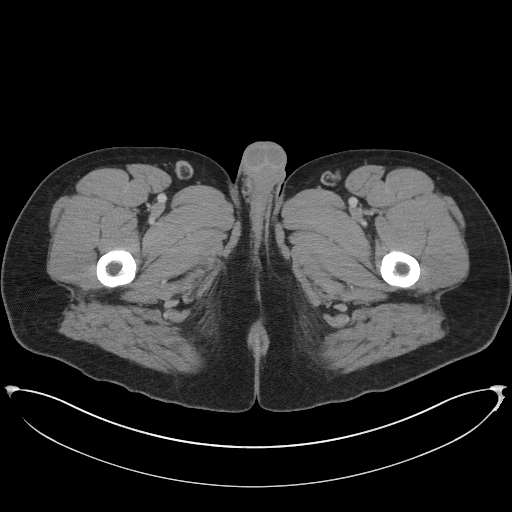
[im 6/106  bone]
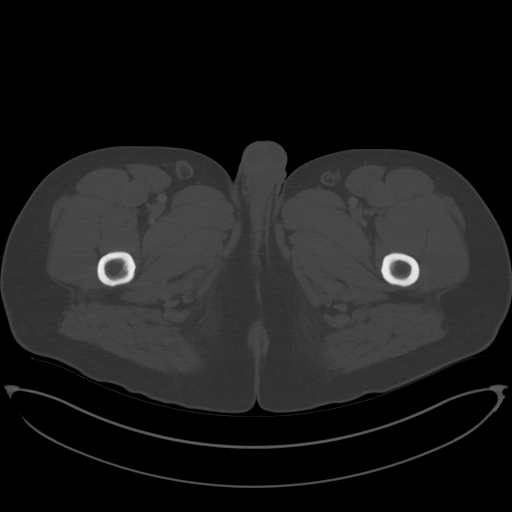
[im 12/106  soft-tissue]
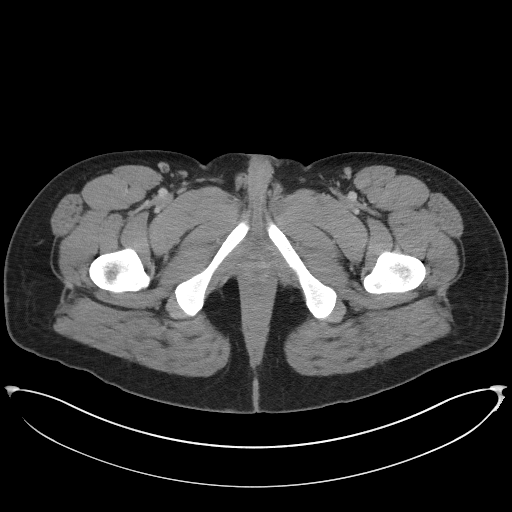
[im 24/106  soft-tissue]
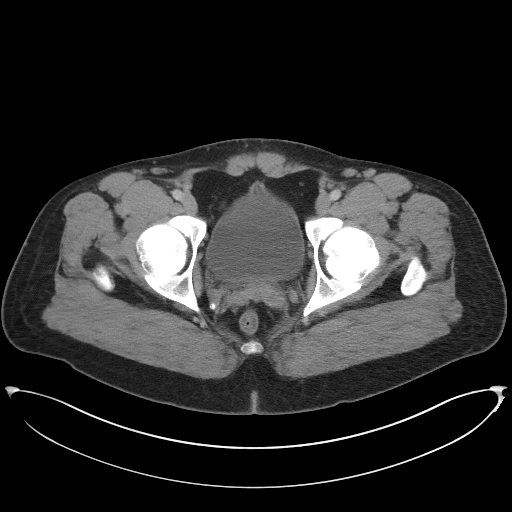
[im 30/106  soft-tissue]
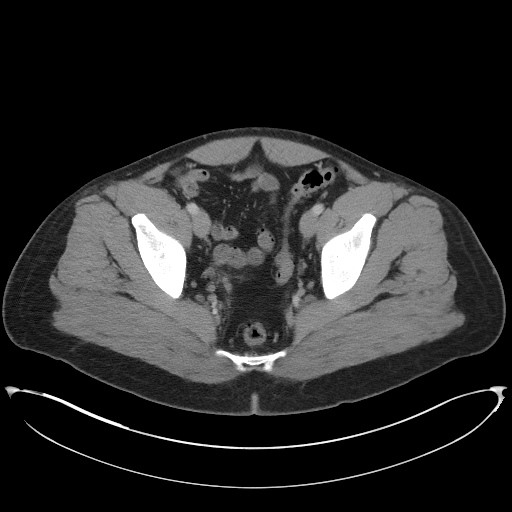
[im 36/106  soft-tissue]
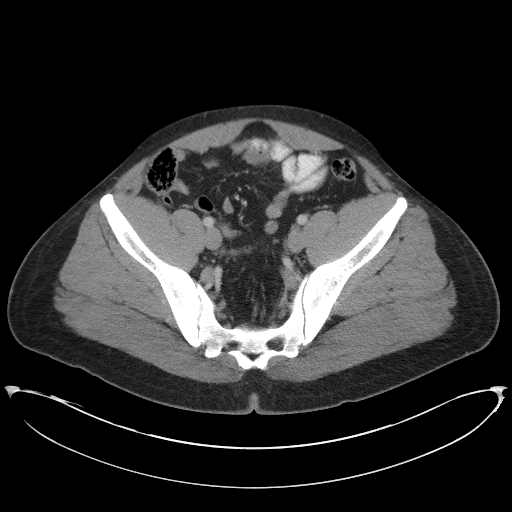
[im 47/106  soft-tissue]
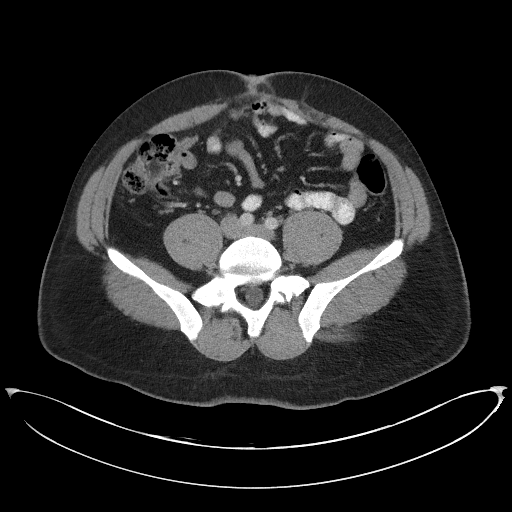
[im 53/106  soft-tissue]
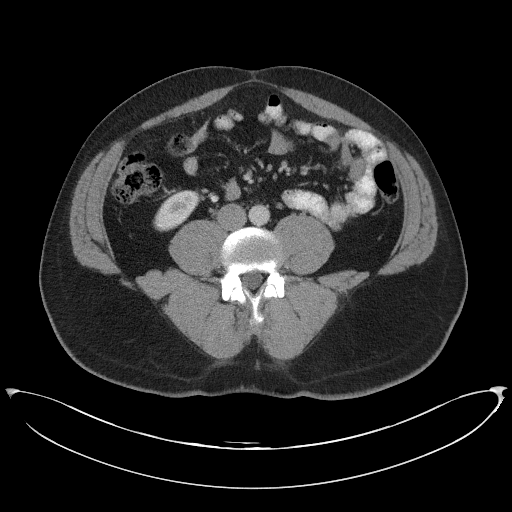
[im 59/106  soft-tissue]
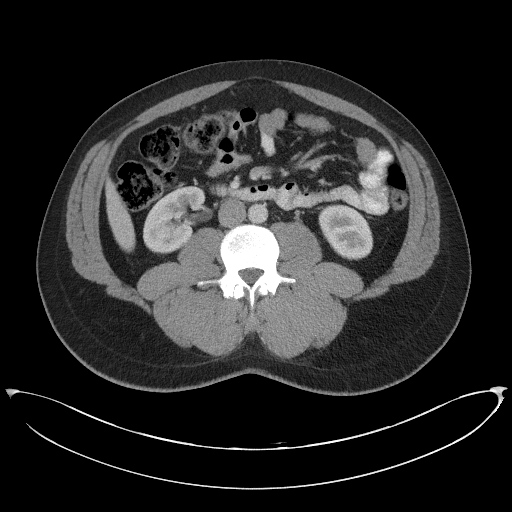
[im 71/106  soft-tissue]
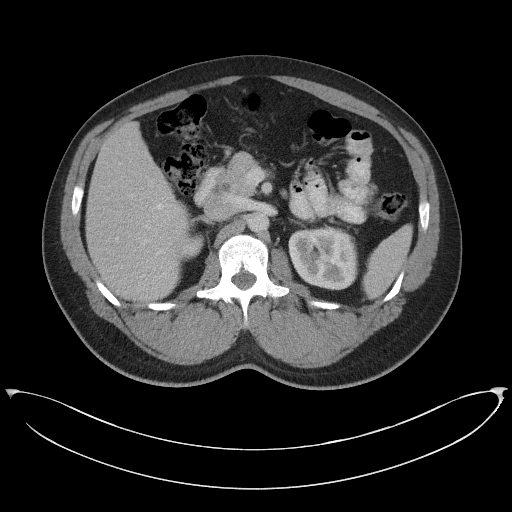
[im 71/106  bone]
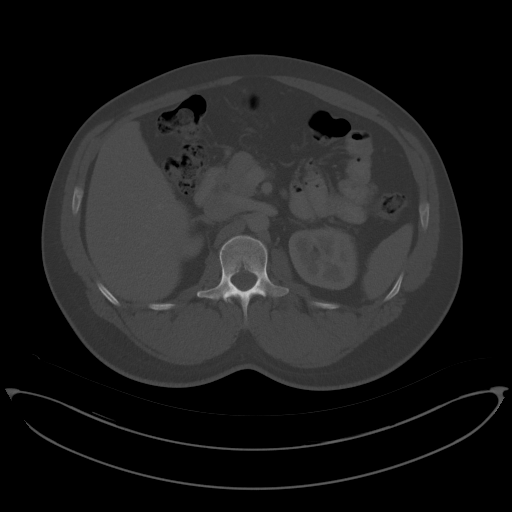
[im 76/106  soft-tissue]
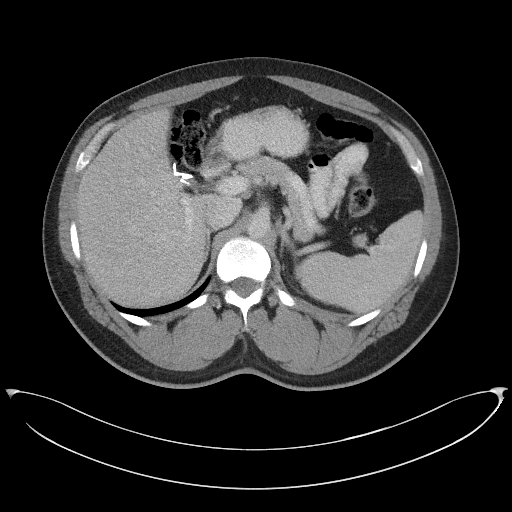
[im 82/106  soft-tissue]
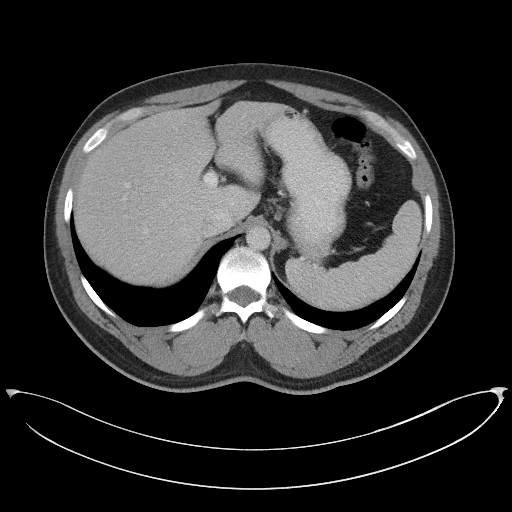
[im 94/106  soft-tissue]
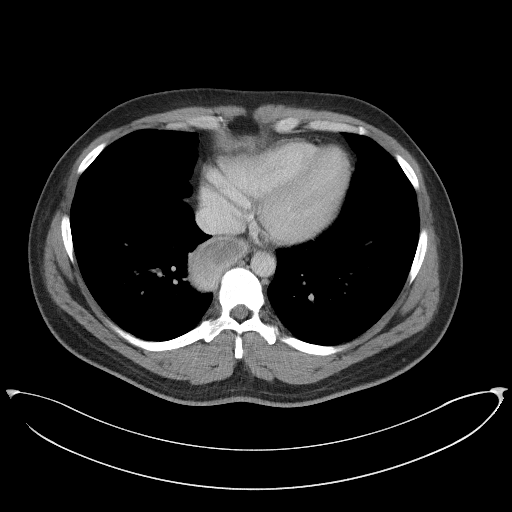
[im 100/106  soft-tissue]
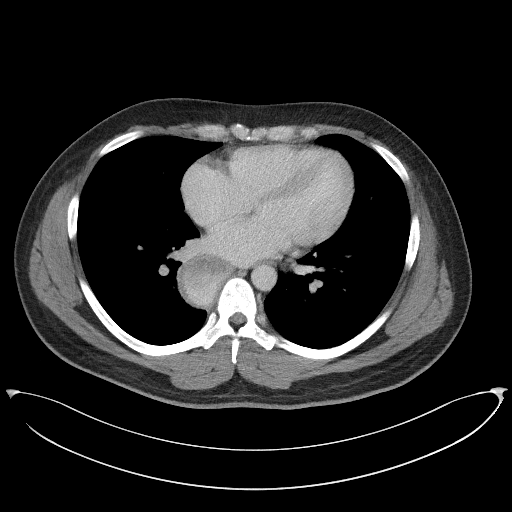

[Series 5: coronal st · coronal · 0.94mm/px · 3 of 94 slices shown]
[im 32/94  soft-tissue]
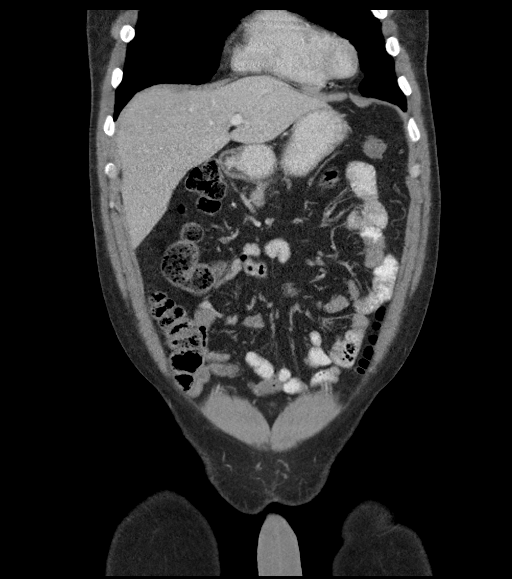
[im 42/94  soft-tissue]
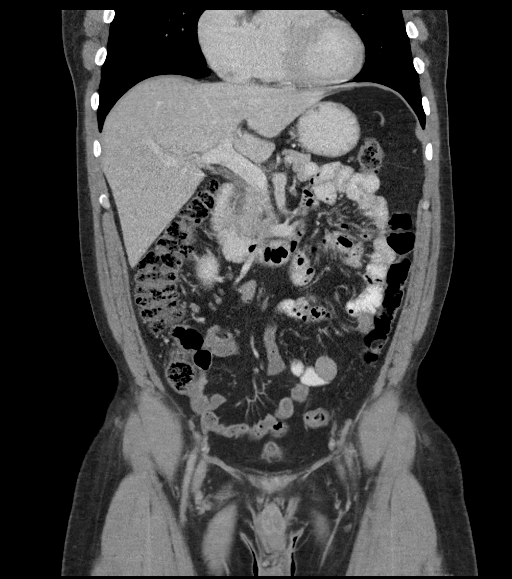
[im 52/94  soft-tissue]
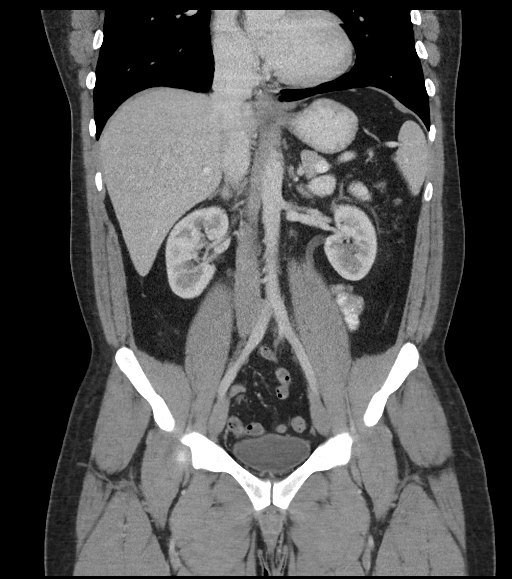

[16 of 46 positions shown; findings below may reference images not displayed]

FINDINGS: LOWER CHEST: There is no basilar pleural or apical pericardial
effusion.

HEPATOBILIARY: The hepatic contours and density are normal. There is
no intra- or extrahepatic biliary dilatation. Status post
cholecystectomy.

PANCREAS: The pancreatic parenchymal contours are normal and there
is no ductal dilatation. There is no peripancreatic fluid
collection.

SPLEEN: Normal.

ADRENALS/URINARY TRACT:

--Adrenal glands: Normal.

--Right kidney/ureter: No hydronephrosis, nephroureterolithiasis,
perinephric stranding or solid renal mass.

--Left kidney/ureter: No hydronephrosis, nephroureterolithiasis,
perinephric stranding or solid renal mass.

--Urinary bladder: Normal for degree of distention

STOMACH/BOWEL:

--Stomach/Duodenum: There is no hiatal hernia or other gastric
abnormality. The duodenal course and caliber are normal.

--Small bowel: No dilatation or inflammation.

--Colon: No focal abnormality.

--Appendix: Normal.

VASCULAR/LYMPHATIC: Normal course and caliber of the major abdominal
vessels. No abdominal or pelvic lymphadenopathy.

REPRODUCTIVE: Normal prostate size with symmetric seminal vesicles.

MUSCULOSKELETAL. No bony spinal canal stenosis or focal osseous
abnormality.

OTHER: Small, unremarkable periumbilical hernia.
IMPRESSION: No acute abnormality of the abdomen or pelvis.
